# Patient Record
Sex: Male | Born: 1940 | Race: White | Hispanic: No | Marital: Married | Smoking: Former smoker
Health system: Southern US, Community
[De-identification: ages and names within clinical notes are randomized; demographics above are authoritative.]

## PROBLEM LIST (undated history)

## (undated) DIAGNOSIS — I10 Essential (primary) hypertension: Secondary | ICD-10-CM

## (undated) DIAGNOSIS — F0390 Unspecified dementia without behavioral disturbance: Secondary | ICD-10-CM

## (undated) DIAGNOSIS — E785 Hyperlipidemia, unspecified: Secondary | ICD-10-CM

## (undated) DIAGNOSIS — E119 Type 2 diabetes mellitus without complications: Secondary | ICD-10-CM

## (undated) DIAGNOSIS — I739 Peripheral vascular disease, unspecified: Secondary | ICD-10-CM

## (undated) DIAGNOSIS — I442 Atrioventricular block, complete: Secondary | ICD-10-CM

## (undated) DIAGNOSIS — Z950 Presence of cardiac pacemaker: Secondary | ICD-10-CM

## (undated) DIAGNOSIS — K219 Gastro-esophageal reflux disease without esophagitis: Secondary | ICD-10-CM

## (undated) DIAGNOSIS — H9190 Unspecified hearing loss, unspecified ear: Secondary | ICD-10-CM

## (undated) DIAGNOSIS — C801 Malignant (primary) neoplasm, unspecified: Secondary | ICD-10-CM

## (undated) HISTORY — PX: SURGERY OF LIP: SUR1315

## (undated) HISTORY — PX: COLONOSCOPY: SHX174

## (undated) HISTORY — PX: INSERT / REPLACE / REMOVE PACEMAKER: SUR710

## (undated) HISTORY — PX: OTHER SURGICAL HISTORY: SHX169

## (undated) HISTORY — PX: CARDIAC CATHETERIZATION: SHX172

---

## 2006-09-19 ENCOUNTER — Ambulatory Visit: Payer: Self-pay | Admitting: Gastroenterology

## 2006-12-02 ENCOUNTER — Other Ambulatory Visit: Payer: Self-pay

## 2006-12-02 ENCOUNTER — Emergency Department: Payer: Self-pay | Admitting: Emergency Medicine

## 2006-12-13 ENCOUNTER — Ambulatory Visit: Payer: Self-pay | Admitting: Unknown Physician Specialty

## 2008-03-13 ENCOUNTER — Ambulatory Visit: Payer: Self-pay | Admitting: Cardiology

## 2008-03-14 ENCOUNTER — Ambulatory Visit: Payer: Self-pay | Admitting: Cardiology

## 2012-07-13 ENCOUNTER — Ambulatory Visit: Payer: Self-pay | Admitting: Otolaryngology

## 2012-07-13 LAB — CREATININE, SERUM
Creatinine: 0.87 mg/dL (ref 0.60–1.30)
EGFR (Non-African Amer.): >60

## 2017-12-25 ENCOUNTER — Encounter: Payer: Self-pay | Admitting: Physician Assistant

## 2017-12-25 ENCOUNTER — Other Ambulatory Visit: Payer: Self-pay

## 2017-12-25 ENCOUNTER — Emergency Department
Admission: EM | Admit: 2017-12-25 | Discharge: 2017-12-25 | Disposition: A | Discharge status: Home / Self Care | Payer: Medicare PPO | Attending: Emergency Medicine | Admitting: Emergency Medicine

## 2017-12-25 ENCOUNTER — Emergency Department: Payer: Medicare PPO

## 2017-12-25 DIAGNOSIS — S61412A Laceration without foreign body of left hand, initial encounter: Secondary | ICD-10-CM

## 2017-12-25 DIAGNOSIS — Y999 Unspecified external cause status: Secondary | ICD-10-CM | POA: Diagnosis not present

## 2017-12-25 DIAGNOSIS — Z23 Encounter for immunization: Secondary | ICD-10-CM | POA: Insufficient documentation

## 2017-12-25 DIAGNOSIS — W268XXA Contact with other sharp object(s), not elsewhere classified, initial encounter: Secondary | ICD-10-CM | POA: Insufficient documentation

## 2017-12-25 DIAGNOSIS — Z7984 Long term (current) use of oral hypoglycemic drugs: Secondary | ICD-10-CM | POA: Insufficient documentation

## 2017-12-25 DIAGNOSIS — Z79899 Other long term (current) drug therapy: Secondary | ICD-10-CM | POA: Diagnosis not present

## 2017-12-25 DIAGNOSIS — Z7982 Long term (current) use of aspirin: Secondary | ICD-10-CM | POA: Insufficient documentation

## 2017-12-25 DIAGNOSIS — S62327B Displaced fracture of shaft of fifth metacarpal bone, left hand, initial encounter for open fracture: Secondary | ICD-10-CM

## 2017-12-25 DIAGNOSIS — Y929 Unspecified place or not applicable: Secondary | ICD-10-CM | POA: Diagnosis not present

## 2017-12-25 DIAGNOSIS — Y9389 Activity, other specified: Secondary | ICD-10-CM | POA: Diagnosis not present

## 2017-12-25 MED ORDER — SULFAMETHOXAZOLE-TRIMETHOPRIM 800-160 MG PO TABS
1.0000 | ORAL_TABLET | Freq: Two times a day (BID) | ORAL | 0 refills | Status: DC
Start: 2017-12-25 — End: 2019-08-06

## 2017-12-25 MED ORDER — LIDOCAINE HCL (PF) 1 % IJ SOLN
10.0000 mL | Freq: Once | INTRAMUSCULAR | Status: AC
  Administered 2017-12-25: 10 mL
  Filled 2017-12-25: qty 10

## 2017-12-25 MED ORDER — SULFAMETHOXAZOLE-TRIMETHOPRIM 800-160 MG PO TABS
1.0000 | ORAL_TABLET | Freq: Once | ORAL | Status: AC
  Administered 2017-12-25: 1 via ORAL
  Filled 2017-12-25: qty 1

## 2017-12-25 MED ORDER — TETANUS-DIPHTH-ACELL PERTUSSIS 5-2.5-18.5 LF-MCG/0.5 IM SUSP
0.5000 mL | Freq: Once | INTRAMUSCULAR | Status: AC
  Administered 2017-12-25: 0.5 mL via INTRAMUSCULAR
  Filled 2017-12-25: qty 0.5

## 2017-12-25 MED ORDER — TRAMADOL HCL 50 MG PO TABS: 50.0000 mg | ORAL_TABLET | Freq: Two times a day (BID) | ORAL | 0 refills | Status: DC

## 2017-12-25 NOTE — Discharge Instructions (Addendum)
Keep the wound & splint clean and dry. Change the dressing daily, if needed. Wash with soap & water as needed. Use gloves to keep the wound & dressing clean and dry. Call Dr. Jackqulyn Livings for fracture care. Follow-up with Dr. Baldemar Lenis for suture removal in 2 weeks.

## 2017-12-25 NOTE — ED Notes (Signed)
See triage note   States he caught his left hand in fan  Laceration to lateral aspect on left hand

## 2017-12-25 NOTE — ED Triage Notes (Signed)
Pt states he was working on a running [facility] unit and the fan caught his left hand causing approximately 4in lac, bleeding in controlled, clean saline gauze applied to wound,

## 2017-12-25 NOTE — ED Provider Notes (Signed)
[facility] Emergency Department Provider Note ____________________________________________  Time seen: 1335  I have reviewed the triage vital signs and the nursing notes.  HISTORY  Chief Complaint  Laceration  HPI Cole Mccarty. is a 77 y.o. right-handed male presents himself to the ED for evaluation of an accidental left hand laceration.  He admits to working on an [employer], and did not disconnect the power source.  Apparently as he reached in to the fan unit the motor turned on causing the fan to hit his lateral left hand.  He presents now with an approximately 7 cm laceration across the dorsal aspect of the hand that extends into the fourth interspace.  He denies any other injury at this time and does not have a current tetanus status.  Bleeding is currently controlled.  The wound according to the patient does expose the underlying tendons and vessels.  He admits to normal fist at this time and denies any significant pain.  History reviewed. No pertinent past medical history.  There are no active problems to display for this patient.  History reviewed. No pertinent surgical history.  Prior to Admission medications   Medication Sig Start Date End Date Taking? Authorizing Provider  amLODipine (NORVASC) 5 MG tablet Take 5 mg by mouth daily.   Yes [provider]  aspirin 81 MG chewable tablet Chew 81 mg by mouth daily.   Yes [provider]  losartan-hydrochlorothiazide (HYZAAR) 100-25 MG tablet Take 1 tablet by mouth daily.   Yes [provider]  metFORMIN (GLUCOPHAGE) 1000 MG tablet Take 1,000 mg by mouth 2 (two) times daily with a meal.   Yes [provider]  Misc Natural Products (LUTEIN 20 PO) Take by mouth.   Yes [provider]  rosuvastatin (CRESTOR) 20 MG tablet Take 20 mg by mouth daily.   Yes [provider]  sulfamethoxazole-trimethoprim (BACTRIM DS,SEPTRA DS) 800-160 MG tablet Take 1 tablet by mouth  2 (two) times daily. 12/25/17   Arelys Glassco, Dannielle Karvonen, PA-C  traMADol (ULTRAM) 50 MG tablet Take 1 tablet (50 mg total) by mouth 2 (two) times daily. 12/25/17   Mekia Dipinto, Dannielle Karvonen, PA-C    Allergies Ezetimibe; Niacin; Niacin-lovastatin er; and Simvastatin  No family history on file.  Social History Social History   Tobacco Use  . Smoking status: Not on file  Substance Use Topics  . Alcohol use: Not on file  . Drug use: Not on file    Review of Systems  Constitutional: Negative for fever. Cardiovascular: Negative for chest pain. Respiratory: Negative for shortness of breath. Musculoskeletal: Negative for back pain. Skin: Negative for rash. Left hand laceration as above Neurological: Negative for headaches, focal weakness or numbness. ____________________________________________  PHYSICAL EXAM:  VITAL SIGNS: ED Triage Vitals  Enc Vitals Group     BP 12/25/17 1255 (!) 120/53     Pulse Rate 12/25/17 1255 90     Resp 12/25/17 1255 20     Temp 12/25/17 1255 98.2 F (36.8 C)     Temp Source 12/25/17 1255 Oral     SpO2 12/25/17 1255 94 %     Weight 12/25/17 1256 170 lb (77.1 kg)     Height 12/25/17 1256 5\' 4"  (1.626 m)     Head Circumference --      Peak Flow --      Pain Score 12/25/17 1244 5     Pain Loc --      Pain Edu? --  Excl. in [place]? --     Constitutional: Alert and oriented. Well appearing and in no distress. Head: Normocephalic and atraumatic. Cardiovascular: Normal rate, regular rhythm. Normal distal pulses and cap refill. Respiratory: Normal respiratory effort. No wheezes/rales/rhonchi. Musculoskeletal: Left hand with normal composite fist.  Patient with a large laceration of the dorsal aspect of the lateral hand between the fourth and fifth metacarpals.  Exposure of the underlying extensor tendons is noted with an incomplete laceration to the extensor digiti minimi.  Patient is able to demonstrate normal composite fist on exam.  Nontender with  normal range of motion in all other extremities.  Neurologic:  Normal sensation. Normal speech and language. No gross focal neurologic deficits are appreciated. Skin:  Skin is warm, dry and intact. No rash noted. ____________________________________________   RADIOLOGY  Left Hand   IMPRESSION: 1. Laceration of left hand overlying the fifth metacarpal. Comminuted fracture of the mid shaft of the fifth metacarpal with apex dorsal angulation. ____________________________________________  PROCEDURES  Bactrim DS 1 PO Tdap 0.5 ml IM  .Marland KitchenLaceration Repair Date/Time: 12/25/2017 5:58 PM Performed by: Janyth Pupa, Student-PA Authorized by: Melvenia Needles, PA-C   Consent:    Consent obtained:  Verbal   Consent given by:  Patient   Risks discussed:  Poor wound healing and nerve damage   Alternatives discussed:  Referral Anesthesia (see MAR for exact dosages):    Anesthesia method:  Local infiltration   Local anesthetic:  Lidocaine 1% w/o epi Laceration details:    Location:  Hand   Hand location:  L hand, dorsum   Length (cm):  7 Repair type:    Repair type:  Intermediate Pre-procedure details:    Preparation:  Patient was prepped and draped in usual sterile fashion Exploration:    Hemostasis achieved with:  Direct pressure   Wound extent: tendon damage     Tendon damage location:  Upper extremity   Upper extremity tendon damage location:  Finger extensor   Finger extensor tendon:  Extensor digiti minimi   Tendon damage extent:  Partial transection   Tendon repair plan:  Refer for evaluation   Contaminated: no   Treatment:    Area cleansed with:  Betadine and saline   Irrigation solution:  Sterile saline   Irrigation method:  Syringe Subcutaneous repair:    Suture size:  4-0   Suture material:  Vicryl   Suture technique:  Running   Number of sutures:  1 Skin repair:    Repair method:  Sutures   Suture size:  3-0   Suture material:  Nylon   Suture  technique:  Simple interrupted   Number of sutures:  13 Approximation:    Approximation:  Close Post-procedure details:    Dressing:  Non-adherent dressing and splint for protection   Patient tolerance of procedure:  Tolerated well, no immediate complications  .Splint Application Date/Time: 2/42/3536 6:01 PM Performed by: Janyth Pupa, Student-PA Authorized by: Melvenia Needles, PA-C   Consent:    Consent obtained:  Verbal   Consent given by:  Patient   Risks discussed:  Swelling Pre-procedure details:    Sensation:  Normal Procedure details:    Laterality:  Left   Location:  Hand   Splint type:  Ulnar gutter   Supplies:  Cotton padding, elastic bandage and Ortho-Glass Post-procedure details:    Pain:  Improved   Sensation:  Normal   Patient tolerance of procedure:  Tolerated well, no immediate complications  ____________________________________________  INITIAL IMPRESSION / ASSESSMENT AND PLAN / ED COURSE  With ED evaluation of an accidental injury to the left hand.  Patient was reaction to a hiatal but powered AC unit when the unit motor excellently engaged.  Because of an accident laceration and open fracture to the left hand.  Patient was treated here appropriately with wound repair and initial fracture management.  He is placed in a ulnar gutter splint and given referral to Ortho-hand for further fracture management.  He is also given a prescription for Bactrim to dose as directed.  He is provided with Ultram for pain as needed.  He will follow-up with Ortho return to the ED as needed.  Work activities as tolerated.  I reviewed the patient's prescription history over the last 12 months in the multi-state controlled substances database(s) that includes [place], [place], [place], [place], [place], [place], [place], [place], [place], [place], [place], [place], [place], and [place].  Results were notable for no current prescriptions.   ____________________________________________  FINAL CLINICAL IMPRESSION(S) / ED DIAGNOSES  Final diagnoses:  Laceration of left hand without foreign body, initial encounter  Open displaced fracture of shaft of fifth metacarpal bone of left hand, initial encounter      Melvenia Needles, PA-C 12/25/17 1803    Arta Silence, MD 12/25/17 2047

## 2018-02-09 ENCOUNTER — Encounter: Payer: Self-pay | Admitting: *Deleted

## 2018-02-12 ENCOUNTER — Ambulatory Visit: Payer: Medicare PPO | Admitting: Certified Registered Nurse Anesthetist

## 2018-02-12 ENCOUNTER — Encounter
Admission: RE | Disposition: A | Discharge status: Home / Self Care | Payer: Self-pay | Source: Ambulatory Visit | Attending: Gastroenterology

## 2018-02-12 ENCOUNTER — Other Ambulatory Visit: Payer: Self-pay

## 2018-02-12 ENCOUNTER — Encounter: Payer: Self-pay | Admitting: *Deleted

## 2018-02-12 ENCOUNTER — Ambulatory Visit
Admission: RE | Admit: 2018-02-12 | Discharge: 2018-02-12 | Disposition: A | Discharge status: Home / Self Care | Payer: Medicare PPO | Source: Ambulatory Visit | Attending: Gastroenterology | Admitting: Gastroenterology

## 2018-02-12 DIAGNOSIS — Z7984 Long term (current) use of oral hypoglycemic drugs: Secondary | ICD-10-CM | POA: Insufficient documentation

## 2018-02-12 DIAGNOSIS — E785 Hyperlipidemia, unspecified: Secondary | ICD-10-CM | POA: Diagnosis not present

## 2018-02-12 DIAGNOSIS — Z7982 Long term (current) use of aspirin: Secondary | ICD-10-CM | POA: Insufficient documentation

## 2018-02-12 DIAGNOSIS — K635 Polyp of colon: Secondary | ICD-10-CM | POA: Diagnosis not present

## 2018-02-12 DIAGNOSIS — Z888 Allergy status to other drugs, medicaments and biological substances status: Secondary | ICD-10-CM | POA: Diagnosis not present

## 2018-02-12 DIAGNOSIS — D123 Benign neoplasm of transverse colon: Secondary | ICD-10-CM | POA: Insufficient documentation

## 2018-02-12 DIAGNOSIS — Z1211 Encounter for screening for malignant neoplasm of colon: Secondary | ICD-10-CM | POA: Diagnosis present

## 2018-02-12 DIAGNOSIS — Z85819 Personal history of malignant neoplasm of unspecified site of lip, oral cavity, and pharynx: Secondary | ICD-10-CM | POA: Insufficient documentation

## 2018-02-12 DIAGNOSIS — I10 Essential (primary) hypertension: Secondary | ICD-10-CM | POA: Diagnosis not present

## 2018-02-12 DIAGNOSIS — E1151 Type 2 diabetes mellitus with diabetic peripheral angiopathy without gangrene: Secondary | ICD-10-CM | POA: Diagnosis not present

## 2018-02-12 DIAGNOSIS — Z79899 Other long term (current) drug therapy: Secondary | ICD-10-CM | POA: Insufficient documentation

## 2018-02-12 DIAGNOSIS — K5730 Diverticulosis of large intestine without perforation or abscess without bleeding: Secondary | ICD-10-CM | POA: Diagnosis not present

## 2018-02-12 DIAGNOSIS — I442 Atrioventricular block, complete: Secondary | ICD-10-CM | POA: Insufficient documentation

## 2018-02-12 DIAGNOSIS — Z950 Presence of cardiac pacemaker: Secondary | ICD-10-CM | POA: Diagnosis not present

## 2018-02-12 HISTORY — DX: Essential (primary) hypertension: I10

## 2018-02-12 HISTORY — DX: Type 2 diabetes mellitus without complications: E11.9

## 2018-02-12 HISTORY — DX: Gastro-esophageal reflux disease without esophagitis: K21.9

## 2018-02-12 HISTORY — DX: Peripheral vascular disease, unspecified: I73.9

## 2018-02-12 HISTORY — PX: COLONOSCOPY WITH PROPOFOL: SHX5780

## 2018-02-12 HISTORY — DX: Hyperlipidemia, unspecified: E78.5

## 2018-02-12 HISTORY — DX: Atrioventricular block, complete: I44.2

## 2018-02-12 HISTORY — DX: Malignant (primary) neoplasm, unspecified: C80.1

## 2018-02-12 LAB — GLUCOSE, CAPILLARY: Glucose-Capillary: 137 mg/dL — ABNORMAL HIGH (ref 70–99)

## 2018-02-12 SURGERY — COLONOSCOPY WITH PROPOFOL
Anesthesia: General

## 2018-02-12 MED ORDER — PROPOFOL 10 MG/ML IV BOLUS
INTRAVENOUS | Status: DC | PRN
  Administered 2018-02-12: 70 mg via INTRAVENOUS

## 2018-02-12 MED ORDER — PROPOFOL 500 MG/50ML IV EMUL
INTRAVENOUS | Status: AC
  Filled 2018-02-12: qty 50

## 2018-02-12 MED ORDER — SODIUM CHLORIDE 0.9 % IV SOLN
INTRAVENOUS | Status: DC
  Administered 2018-02-12: 10:00:00 via INTRAVENOUS

## 2018-02-12 MED ORDER — LIDOCAINE HCL (PF) 2 % IJ SOLN
INTRAMUSCULAR | Status: AC
  Filled 2018-02-12: qty 10

## 2018-02-12 MED ORDER — PROPOFOL 500 MG/50ML IV EMUL
INTRAVENOUS | Status: DC | PRN
Start: 2018-02-12 — End: 2018-02-12
  Administered 2018-02-12: 130 ug/kg/min via INTRAVENOUS

## 2018-02-12 MED ORDER — LIDOCAINE HCL (CARDIAC) PF 100 MG/5ML IV SOSY
PREFILLED_SYRINGE | INTRAVENOUS | Status: DC | PRN
  Administered 2018-02-12: 50 mg via INTRAVENOUS

## 2018-02-12 MED ORDER — SODIUM CHLORIDE 0.9 % IV SOLN: INTRAVENOUS | Status: DC

## 2018-02-12 NOTE — Anesthesia Post-op Follow-up Note (Signed)
Anesthesia QCDR form completed.        

## 2018-02-12 NOTE — Anesthesia Preprocedure Evaluation (Signed)
Anesthesia Evaluation  Patient identified by MRN, date of birth, ID band Patient awake    Reviewed: Allergy & Precautions, H&P , NPO status , Patient's Chart, lab work & pertinent test results, reviewed documented beta blocker date and time   Airway Mallampati: II   Neck ROM: full    Dental  (+) Poor Dentition, Teeth Intact   Pulmonary neg pulmonary ROS, former smoker,    Pulmonary exam normal        Cardiovascular Exercise Tolerance: Good hypertension, On Medications + Peripheral Vascular Disease  negative cardio ROS Normal cardiovascular exam+ dysrhythmias  Rhythm:regular Rate:Normal     Neuro/Psych negative neurological ROS  negative psych ROS   GI/Hepatic negative GI ROS, Neg liver ROS, GERD  Medicated,  Endo/Other  negative endocrine ROSdiabetes, Well Controlled, Type 2  Renal/GU negative Renal ROS  negative genitourinary   Musculoskeletal   Abdominal   Peds  Hematology negative hematology ROS (+)   Anesthesia Other Findings Past Medical History: No date: Cancer ([place])     Comment:  lip cancer No date: Complete heart block (HCC) No date: Diabetes mellitus without complication (HCC) No date: GERD (gastroesophageal reflux disease) No date: Hyperlipidemia No date: Hypertension No date: PVD (peripheral vascular disease) ([place]) Past Surgical History: No date: COLONOSCOPY No date: insert/replace/pacemaker No date: SURGERY OF LIP   Reproductive/Obstetrics negative OB ROS                             Anesthesia Physical Anesthesia Plan  ASA: III  Anesthesia Plan: General   Post-op Pain Management:    Induction:   PONV Risk Score and Plan:   Airway Management Planned:   Additional Equipment:   Intra-op Plan:   Post-operative Plan:   Informed Consent: I have reviewed the patients History and Physical, chart, labs and discussed the procedure including the risks, benefits  and alternatives for the proposed anesthesia with the patient or authorized representative who has indicated his/her understanding and acceptance.   Dental Advisory Given  Plan Discussed with: CRNA  Anesthesia Plan Comments:         Anesthesia Quick Evaluation

## 2018-02-12 NOTE — Transfer of Care (Signed)
Immediate Anesthesia Transfer of Care Note  Patient: Cole Jordan Pewitt Sr.  Procedure(s) Performed: COLONOSCOPY WITH PROPOFOL (N/A )  Patient Location: PACU and Endoscopy Unit  Anesthesia Type:General  Level of Consciousness: drowsy  Airway & Oxygen Therapy: Patient Spontanous Breathing and Patient connected to nasal cannula oxygen  Post-op Assessment: Report given to RN and Post -op Vital signs reviewed and stable  Post vital signs: Reviewed and stable  Last Vitals:  Vitals Value Taken Time  BP    Temp    Pulse 66 02/12/2018 10:44 AM  Resp 11 02/12/2018 10:44 AM  SpO2 95 % 02/12/2018 10:44 AM  Vitals shown include unvalidated device data.  Last Pain:  Vitals:   02/12/18 0939  TempSrc: Tympanic  PainSc: 0-No pain         Complications: No apparent anesthesia complications

## 2018-02-12 NOTE — Anesthesia Postprocedure Evaluation (Signed)
Anesthesia Post Note  Patient: [place]  Procedure(s) Performed: COLONOSCOPY WITH PROPOFOL (N/A )  Patient location during evaluation: PACU Anesthesia Type: General Level of consciousness: awake and alert Pain management: pain level controlled Vital Signs Assessment: post-procedure vital signs reviewed and stable Respiratory status: spontaneous breathing, nonlabored ventilation, respiratory function stable and patient connected to nasal cannula oxygen Cardiovascular status: blood pressure returned to baseline and stable Postop Assessment: no apparent nausea or vomiting Anesthetic complications: no     Last Vitals:  Vitals:   02/12/18 1100 02/12/18 1110  BP: 112/63 119/67  Pulse:  65  Resp:  15  Temp:    SpO2:  100%    Last Pain:  Vitals:   02/12/18 1040  TempSrc: Tympanic  PainSc:                  Molli Barrows

## 2018-02-12 NOTE — Op Note (Signed)
[facility] Gastroenterology Patient Name: Cole Mccarty Procedure Date: 02/12/2018 10:15 AM MRN: [id number] Account #: [id number] Date of Birth: 09-12-1940 Admit Type: Outpatient Age: 77 Room: [facility] ENDO ROOM 1 Gender: Male Note Status: Finalized Procedure:            Colonoscopy Indications:          Screening for colorectal malignant neoplasm Providers:            Lollie Sails, MD Referring MD:         Caprice Renshaw MD (Referring MD) Medicines:            Monitored Anesthesia Care Complications:        No immediate complications. Procedure:            Pre-Anesthesia Assessment:                       - ASA Grade Assessment: III - A patient with severe                        systemic disease.                       After obtaining informed consent, the colonoscope was                        passed under direct vision. Throughout the procedure,                        the patient's blood pressure, pulse, and oxygen                        saturations were monitored continuously. The                        Colonoscope was introduced through the anus and                        advanced to the the cecum, identified by appendiceal                        orifice and ileocecal valve. The colonoscopy was                        performed without difficulty. The patient tolerated the                        procedure well. The quality of the bowel preparation                        was good. Findings:      A 4 mm polyp was found in the distal transverse colon. The polyp was       sessile. The polyp was removed with a cold snare. Resection and       retrieval were complete.      A 4 mm polyp was found in the distal sigmoid colon. The polyp was       sessile. The polyp was removed with a cold snare. Resection and       retrieval were complete.      Multiple small-mouthed diverticula were found in the sigmoid colon and  descending colon.      The retroflexed view of  the distal rectum and anal verge was normal and       showed no anal or rectal abnormalities.      The digital rectal exam was normal. Impression:           - One 4 mm polyp in the distal transverse colon,                        removed with a cold snare. Resected and retrieved.                       - One 4 mm polyp in the distal sigmoid colon, removed                        with a cold snare. Resected and retrieved.                       - Diverticulosis in the sigmoid colon and in the                        descending colon.                       - The distal rectum and anal verge are normal on                        retroflexion view. Recommendation:       - Discharge patient to home.                       - Advance diet as tolerated.                       - Await pathology results.                       - Telephone GI clinic for pathology results in 1 week. Procedure Code(s):    --- Professional ---                       [id number], Colonoscopy, flexible; with removal of tumor(s),                        polyp(s), or other lesion(s) by snare technique Diagnosis Code(s):    --- Professional ---                       Z12.11, Encounter for screening for malignant neoplasm                        of colon                       D12.3, Benign neoplasm of transverse colon (hepatic                        flexure or splenic flexure)                       D12.5, Benign neoplasm of sigmoid colon  K57.30, Diverticulosis of large intestine without                        perforation or abscess without bleeding CPT copyright 2017 American Medical Association. All rights reserved. The codes documented in this report are preliminary and upon coder review may  be revised to meet current compliance requirements. Lollie Sails, MD 02/12/2018 10:42:38 AM This report has been signed electronically. Number of Addenda: 0 Note Initiated On: 02/12/2018 10:15 AM Scope Withdrawal Time: 0  hours 9 minutes 42 seconds  Total Procedure Duration: 0 hours 17 minutes 57 seconds       [facility]

## 2018-02-12 NOTE — H&P (Signed)
Outpatient short stay form Pre-procedure 02/12/2018 10:11 AM Cole Mccarty  Primary Physician: Dr Cole Mccarty  Reason for visit: Colonoscopy  History of present illness: Patient is a 77 year old male presenting today colon cancer screening.  His last colonoscopy was over 10 years ago with a finding of a hyperplastic polyp.  Also some diverticulosis.  She tolerated his procedure well.  He takes a 81 mg aspirin daily.  He takes no other aspirin products or blood thinning agent.  He has held his aspirin.  Patient does have a pacemaker for history of complete heart block.    Current Facility-Administered Medications:  .  0.9 %  sodium chloride infusion, , Intravenous, Continuous, Cole Sails, Mccarty .  0.9 %  sodium chloride infusion, , Intravenous, Continuous, Cole Sails, Mccarty, Last Rate: 20 mL/hr at 02/12/18 1006  Medications Prior to Admission  Medication Sig Dispense Refill Last Dose  . amLODipine (NORVASC) 5 MG tablet Take 5 mg by mouth daily.   02/12/2018 at 0700  . aspirin 81 MG chewable tablet Chew 81 mg by mouth daily.   02/07/2018 at Unknown time  . losartan-hydrochlorothiazide (HYZAAR) 100-25 MG tablet Take 1 tablet by mouth daily.   02/12/2018 at 0700  . metFORMIN (GLUCOPHAGE) 1000 MG tablet Take 1,000 mg by mouth 2 (two) times daily with a meal.   Past Week at Unknown time  . Misc Natural Products (LUTEIN 20 PO) Take by mouth.   Past Week at Unknown time  . rosuvastatin (CRESTOR) 20 MG tablet Take 20 mg by mouth daily.   02/12/2018 at 0700  . sulfamethoxazole-trimethoprim (BACTRIM DS,SEPTRA DS) 800-160 MG tablet Take 1 tablet by mouth 2 (two) times daily. 20 tablet 0 Past Week at Unknown time  . traMADol (ULTRAM) 50 MG tablet Take 1 tablet (50 mg total) by mouth 2 (two) times daily. (Patient not taking: Reported on 02/12/2018) 10 tablet 0 Completed Course at Unknown time     Allergies  Allergen Reactions  . Ezetimibe Other (See Comments)  . Niacin Other (See  Comments)  . Niacin-Lovastatin Er     Other reaction(s): Muscle Pain  . Simvastatin     Other reaction(s): Unknown     Past Medical History:  Diagnosis Date  . Cancer (HCC)    lip cancer  . Complete heart block ([place])   . Diabetes mellitus without complication ([place])   . GERD (gastroesophageal reflux disease)   . Hyperlipidemia   . Hypertension   . PVD (peripheral vascular disease) ([place])     Review of systems:      Physical Exam    Heart and lungs: Rate and rhythm without rub or gallop, lungs are bilaterally clear.    HEENT: Normocephalic atraumatic eyes are anicteric    Other:    Pertinant exam for procedure: Soft nontender nondistended bowel sounds positive normoactive.    Planned proceedures: Colonoscopy and indicated procedures. I have discussed the risks benefits and complications of procedures to include not limited to bleeding, infection, perforation and the risk of sedation and the patient wishes to proceed.    Cole Sails, [facility] 02/12/2018  10:11 AM

## 2018-02-13 ENCOUNTER — Encounter: Payer: Self-pay | Admitting: Gastroenterology

## 2018-02-13 LAB — SURGICAL PATHOLOGY

## 2019-05-12 ENCOUNTER — Encounter: Payer: Self-pay | Admitting: Emergency Medicine

## 2019-05-12 ENCOUNTER — Emergency Department: Payer: Medicare PPO

## 2019-05-12 ENCOUNTER — Other Ambulatory Visit: Payer: Self-pay

## 2019-05-12 DIAGNOSIS — Z87891 Personal history of nicotine dependence: Secondary | ICD-10-CM | POA: Diagnosis not present

## 2019-05-12 DIAGNOSIS — Z85818 Personal history of malignant neoplasm of other sites of lip, oral cavity, and pharynx: Secondary | ICD-10-CM | POA: Diagnosis not present

## 2019-05-12 DIAGNOSIS — Z79899 Other long term (current) drug therapy: Secondary | ICD-10-CM | POA: Insufficient documentation

## 2019-05-12 DIAGNOSIS — I10 Essential (primary) hypertension: Secondary | ICD-10-CM | POA: Insufficient documentation

## 2019-05-12 DIAGNOSIS — M79602 Pain in left arm: Secondary | ICD-10-CM | POA: Diagnosis not present

## 2019-05-12 DIAGNOSIS — E119 Type 2 diabetes mellitus without complications: Secondary | ICD-10-CM | POA: Insufficient documentation

## 2019-05-12 DIAGNOSIS — Z7982 Long term (current) use of aspirin: Secondary | ICD-10-CM | POA: Insufficient documentation

## 2019-05-12 LAB — BASIC METABOLIC PANEL
Anion gap: 13 (ref 5–15)
BUN: 23 mg/dL (ref 8–23)
CO2: 26 mmol/L (ref 22–32)
Calcium: 10.2 mg/dL (ref 8.9–10.3)
Chloride: 102 mmol/L (ref 98–111)
Creatinine, Ser: 1.1 mg/dL (ref 0.61–1.24)
GFR calc Af Amer: >60 mL/min (ref >60)
GFR calc non Af Amer: >60 mL/min (ref >60)
Glucose, Bld: 176 mg/dL — ABNORMAL HIGH (ref 70–99)
Potassium: 3.9 mmol/L (ref 3.5–5.1)
Sodium: 141 mmol/L (ref 135–145)

## 2019-05-12 LAB — CBC
HCT: 42 % (ref 39.0–52.0)
Hemoglobin: 13.5 g/dL (ref 13.0–17.0)
MCH: 29.2 pg (ref 26.0–34.0)
MCHC: 32.1 g/dL (ref 30.0–36.0)
MCV: 90.7 fL (ref 80.0–100.0)
Platelets: 213 10*3/uL (ref 150–400)
RBC: 4.63 MIL/uL (ref 4.22–5.81)
RDW: 13.7 % (ref 11.5–15.5)
WBC: 6.5 10*3/uL (ref 4.0–10.5)
nRBC: 0 % (ref 0.0–0.2)

## 2019-05-12 LAB — TROPONIN I (HIGH SENSITIVITY): Troponin I (High Sensitivity): 14 ng/L (ref <18)

## 2019-05-12 NOTE — ED Triage Notes (Signed)
Patient with complaint of left arm pain this evening times thirty minutes that he states stopped while he was waiting for triage. Patient denies chest pain or shortness of breath. Patient states that he does have a pacemaker.

## 2019-05-13 ENCOUNTER — Encounter: Payer: Self-pay | Admitting: Emergency Medicine

## 2019-05-13 ENCOUNTER — Emergency Department
Admission: EM | Admit: 2019-05-13 | Discharge: 2019-05-13 | Disposition: A | Discharge status: Home / Self Care | Payer: Medicare PPO | Attending: Emergency Medicine | Admitting: Emergency Medicine

## 2019-05-13 DIAGNOSIS — M79602 Pain in left arm: Secondary | ICD-10-CM

## 2019-05-13 HISTORY — DX: Unspecified hearing loss, unspecified ear: H91.90

## 2019-05-13 LAB — TROPONIN I (HIGH SENSITIVITY): Troponin I (High Sensitivity): 5 ng/L (ref <18)

## 2019-05-13 NOTE — Discharge Instructions (Signed)
Your workup in the Emergency Department today was reassuring.  We did not find any specific abnormalities.  We recommend you drink plenty of fluids, take your regular medications and/or any new ones prescribed today, and follow up with the doctor(s) listed in these documents as recommended.  Return to the Emergency Department if you develop new or worsening symptoms that concern you.  

## 2019-05-13 NOTE — ED Provider Notes (Signed)
[facility] Emergency Department Provider Note  ____________________________________________   First MD Initiated Contact with Patient 05/13/19 [phone]     (approximate)  I have reviewed the triage vital signs and the nursing notes.   HISTORY  Chief Complaint Arm Pain    HPI Cole DEVICH Sr. is a 78 y.o. 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  He presents tonight for acute onset pain in his left arm.  He reports that it hurt from the outside of his left shoulder down to the outside of his left forearm.  No history of trauma or strain.  It was both a sharp and aching pain.  Nothing in particular made it better or worse.  It was not worse with moving around and did not radiate into his chest or his back.  He denies any chest pain.  He denies shortness of breath.  He has not had contact with COVID-19 patients.  He denies fever/chills, nausea, vomiting, and abdominal pain.  He reports the pain was severe but it has completely gone away and in fact it went away by itself while he was in triage and he has not had any pain since then.  He has been waiting for a bed for about 6 hours.        Past Medical History:  Diagnosis Date  . Cancer (HCC)    lip cancer  . Complete heart block ([place])   . Diabetes mellitus without complication ([place])   . GERD (gastroesophageal reflux disease)   . Hard of hearing    severe  . Hyperlipidemia   . Hypertension   . PVD (peripheral vascular disease) ([place])     There are no active problems to display for this patient.   Past Surgical History:  Procedure Laterality Date  . COLONOSCOPY    . COLONOSCOPY WITH PROPOFOL N/A 02/12/2018   Procedure: COLONOSCOPY WITH PROPOFOL;  Surgeon: Lollie Sails, MD;  Location: [facility] ENDOSCOPY;  Service: Endoscopy;  Laterality: N/A;  .  insert/replace/pacemaker    . SURGERY OF LIP      Prior to Admission medications   Medication Sig Start Date End Date Taking? Authorizing Provider  amLODipine (NORVASC) 5 MG tablet Take 5 mg by mouth daily.    [provider]  aspirin 81 MG chewable tablet Chew 81 mg by mouth daily.    [provider]  losartan-hydrochlorothiazide (HYZAAR) 100-25 MG tablet Take 1 tablet by mouth daily.    [provider]  metFORMIN (GLUCOPHAGE) 1000 MG tablet Take 1,000 mg by mouth 2 (two) times daily with a meal.    [provider]  Misc Natural Products (LUTEIN 20 PO) Take by mouth.    [provider]  rosuvastatin (CRESTOR) 20 MG tablet Take 20 mg by mouth daily.    [provider]  sulfamethoxazole-trimethoprim (BACTRIM DS,SEPTRA DS) 800-160 MG tablet Take 1 tablet by mouth 2 (two) times daily. 12/25/17   Menshew, Dannielle Karvonen, PA-C  traMADol (ULTRAM) 50 MG tablet Take 1 tablet (50 mg total) by mouth 2 (two) times daily. Patient not taking: Reported on 02/12/2018 12/25/17   Menshew, Dannielle Karvonen, PA-C    Allergies Ezetimibe, Niacin, Niacin-lovastatin er, and Simvastatin  History reviewed. No pertinent family history.  Social History Social History   Tobacco Use  . Smoking status: Former Smoker  Years: 30.00    Types: Cigarettes    Quit date: 07/11/1989    Years since quitting: 29.8  . Smokeless tobacco: Never Used  Substance Use Topics  . Alcohol use: Not Currently  . Drug use: Not Currently    Review of Systems Constitutional: No fever/chills Eyes: No visual changes. ENT: No sore throat. Cardiovascular: Denies chest pain. Respiratory: Denies shortness of breath. Gastrointestinal: No abdominal pain.  No nausea, no vomiting.  No diarrhea.  No constipation. Genitourinary: Negative for dysuria. Musculoskeletal: Left arm pain as described above.  Negative for neck pain.  Negative for back pain. Integumentary: Negative for rash.  Neurological: Negative for headaches, focal weakness or numbness.   ____________________________________________   PHYSICAL EXAM:  VITAL SIGNS: ED Triage Vitals [05/12/19 2135]  Enc Vitals Group     BP 136/71     Pulse Rate 72     Resp 18     Temp 98.1 F (36.7 C)     Temp Source Oral     SpO2 98 %     Weight 63.5 kg (140 lb)     Height 1.676 m (5\' 6" )     Head Circumference      Peak Flow      Pain Score 0     Pain Loc      Pain Edu?      Excl. in [place]?     Constitutional: Alert and oriented.  No acute distress. Eyes: Conjunctivae are normal.  Head: Atraumatic. Nose: No congestion/rhinnorhea. Mouth/Throat: Patient is wearing a mask. Neck: No stridor.  No meningeal signs.   Cardiovascular: Normal rate, regular rhythm. Good peripheral circulation. Grossly normal heart sounds.  Implanted pacemaker. Respiratory: Normal respiratory effort.  No retractions. Gastrointestinal: Soft and nontender. No distention.  Musculoskeletal: No lower extremity tenderness nor edema. No gross deformities of extremities.  The patient has no reproducible pain or tenderness with palpation of the left arm and full range of motion of the arm. Neurologic:  Normal speech and language. No gross focal neurologic deficits are appreciated.  Skin:  Skin is warm, dry and intact. Psychiatric: Mood and affect are normal. Speech and behavior are normal.  ____________________________________________   LABS (all labs ordered are listed, but only abnormal results are displayed)  Labs Reviewed  BASIC METABOLIC PANEL - Abnormal; Notable for the following components:      Result Value   Glucose, Bld 176 (*)    All other components within normal limits  CBC  TROPONIN I (HIGH SENSITIVITY)  TROPONIN I (HIGH SENSITIVITY)   ____________________________________________  EKG  ED ECG REPORT I, Hinda Kehr, the attending physician, personally viewed and interpreted this ECG.  Date: 05/12/2019 EKG Time:  21:26 Rate: 86 Rhythm: paced rhythm QRS Axis: paced rhythm Intervals: abnormal due to pacemaker ST/T Wave abnormalities: Non-specific ST segment / T-wave changes, but no evidence of acute ischemia. Narrative Interpretation: No acute ischemia evidence of paced rhythm    ____________________________________________  RADIOLOGY I, Hinda Kehr, personally viewed and evaluated these images (plain radiographs) as part of my medical decision making, as well as reviewing the written report by the radiologist.  ED MD interpretation: No acute abnormalities on chest x-ray  Official radiology report(s): Dg Chest 2 View  Result Date: 05/12/2019 CLINICAL DATA:  LEFT arm pain this evening for 30 minutes, history of complete heart block with pacemaker placement, diabetes mellitus, hypertension EXAM: CHEST - 2 VIEW COMPARISON:  None FINDINGS: LEFT subclavian sequential transvenous pacemaker leads project at RIGHT atrium and  RIGHT ventricle. Normal heart size and pulmonary vascularity Calcified tortuous aorta. Mild bibasilar atelectasis. Lungs otherwise clear. Prominent first costochondral junctions bilaterally. No infiltrate, pleural effusion or pneumothorax. Bones demineralized. IMPRESSION: Bibasilar atelectasis. Aortic atherosclerosis. Electronically Signed   By: Lavonia Dana M.D.   On: 05/12/2019 22:14    ____________________________________________   PROCEDURES   Procedure(s) performed (including Critical Care):  Procedures   ____________________________________________   INITIAL IMPRESSION / MDM / [place] / ED COURSE  As part of my medical decision making, I reviewed the following data within the [place] notes reviewed and incorporated, Labs reviewed , EKG interpreted , Old EKG reviewed, Radiograph reviewed  and Notes from prior ED visits   Differential diagnosis includes, but is not limited to, musculoskeletal pain/strain, fracture dislocation,  ACS.  The patient is well-appearing in no distress.  No reproducible pain at this time.  He had no chest pain but given his history I will repeat a troponin.  However his EKG is unchanged from prior and demonstrates a paced rhythm, his symptoms have gone completely, and his lab work is all reassuring including an initial high-sensitivity troponin of 5 and a normal basic metabolic panel and CBC.  Chest x-ray is normal.  He made the comment that if he had been younger he would not have come in for the arm pain but he is a little bit more cautious now with age and given his heart problems which I think is understandable.  He is comfortable with the plan for discharge and outpatient follow-up assuming the second high-sensitivity troponin is appropriate.      Clinical Course as of May 13 331  [facility] May 13, 2019  0332 Upon further review, I see that the patient actually has had 2 high-sensitivity troponins tonight, going from 14-5.  No indication for a repeat.  Patient is asymptomatic and comfortable with the plan for discharge.   [CF]    Clinical Course User Index [CF] Hinda Kehr, MD     ____________________________________________  FINAL CLINICAL IMPRESSION(S) / ED DIAGNOSES  Final diagnoses:  Left arm pain     MEDICATIONS GIVEN DURING THIS VISIT:  Medications - No data to display   ED Discharge Orders    None      *Please note:  Cole Fok Bozman Sr. was evaluated in Emergency Department on 05/13/2019 for the symptoms described in the history of present illness. He was evaluated in the context of the global COVID-19 pandemic, which necessitated consideration that the patient might be at risk for infection with the SARS-CoV-2 virus that causes COVID-19. Institutional protocols and algorithms that pertain to the evaluation of patients at risk for COVID-19 are in a state of rapid change based on information released by regulatory bodies including the CDC and federal and state organizations.  These policies and algorithms were followed during the patient's care in the ED.  Some ED evaluations and interventions may be delayed as a result of limited staffing during the pandemic.*  Note:  This document was prepared using Dragon voice recognition software and may include unintentional dictation errors.   Hinda Kehr, MD 05/13/19 [phone]

## 2019-07-22 ENCOUNTER — Ambulatory Visit: Admit: 2019-07-22 | Payer: Medicare PPO | Admitting: Cardiology

## 2019-07-22 SURGERY — INSERTION, CARDIAC PACEMAKER
Anesthesia: Choice | Laterality: Left

## 2019-08-02 ENCOUNTER — Other Ambulatory Visit: Payer: Self-pay

## 2019-08-02 ENCOUNTER — Other Ambulatory Visit
Admission: RE | Admit: 2019-08-02 | Discharge: 2019-08-02 | Disposition: A | Discharge status: Home / Self Care | Payer: Medicare PPO | Source: Ambulatory Visit | Attending: Internal Medicine | Admitting: Internal Medicine

## 2019-08-02 DIAGNOSIS — Z20822 Contact with and (suspected) exposure to covid-19: Secondary | ICD-10-CM | POA: Diagnosis not present

## 2019-08-02 DIAGNOSIS — Z01812 Encounter for preprocedural laboratory examination: Secondary | ICD-10-CM | POA: Diagnosis present

## 2019-08-02 LAB — SARS CORONAVIRUS 2 (TAT 6-24 HRS): SARS Coronavirus 2: NEGATIVE

## 2019-08-06 MED ORDER — CEFAZOLIN SODIUM-DEXTROSE 2-4 GM/100ML-% IV SOLN
2.0000 g | INTRAVENOUS | Status: AC
  Administered 2019-08-07: 2 g via INTRAVENOUS

## 2019-08-07 ENCOUNTER — Ambulatory Visit
Admission: RE | Admit: 2019-08-07 | Discharge: 2019-08-07 | Disposition: A | Discharge status: Home / Self Care | Payer: Medicare PPO | Attending: Cardiology | Admitting: Cardiology

## 2019-08-07 ENCOUNTER — Ambulatory Visit: Payer: Medicare PPO | Admitting: Certified Registered Nurse Anesthetist

## 2019-08-07 ENCOUNTER — Other Ambulatory Visit: Payer: Self-pay

## 2019-08-07 ENCOUNTER — Encounter
Admission: RE | Disposition: A | Discharge status: Home / Self Care | Payer: Self-pay | Source: Home / Self Care | Attending: Cardiology

## 2019-08-07 ENCOUNTER — Encounter: Payer: Self-pay | Admitting: Cardiology

## 2019-08-07 DIAGNOSIS — E1151 Type 2 diabetes mellitus with diabetic peripheral angiopathy without gangrene: Secondary | ICD-10-CM | POA: Insufficient documentation

## 2019-08-07 DIAGNOSIS — I442 Atrioventricular block, complete: Secondary | ICD-10-CM | POA: Insufficient documentation

## 2019-08-07 DIAGNOSIS — Z8249 Family history of ischemic heart disease and other diseases of the circulatory system: Secondary | ICD-10-CM | POA: Diagnosis not present

## 2019-08-07 DIAGNOSIS — Z7982 Long term (current) use of aspirin: Secondary | ICD-10-CM | POA: Insufficient documentation

## 2019-08-07 DIAGNOSIS — Z87891 Personal history of nicotine dependence: Secondary | ICD-10-CM | POA: Diagnosis not present

## 2019-08-07 DIAGNOSIS — Z7984 Long term (current) use of oral hypoglycemic drugs: Secondary | ICD-10-CM | POA: Diagnosis not present

## 2019-08-07 DIAGNOSIS — E7800 Pure hypercholesterolemia, unspecified: Secondary | ICD-10-CM | POA: Diagnosis not present

## 2019-08-07 DIAGNOSIS — I10 Essential (primary) hypertension: Secondary | ICD-10-CM | POA: Diagnosis not present

## 2019-08-07 DIAGNOSIS — I6523 Occlusion and stenosis of bilateral carotid arteries: Secondary | ICD-10-CM | POA: Diagnosis not present

## 2019-08-07 DIAGNOSIS — I480 Paroxysmal atrial fibrillation: Secondary | ICD-10-CM | POA: Diagnosis not present

## 2019-08-07 DIAGNOSIS — Z45010 Encounter for checking and testing of cardiac pacemaker pulse generator [battery]: Secondary | ICD-10-CM | POA: Insufficient documentation

## 2019-08-07 DIAGNOSIS — Z79899 Other long term (current) drug therapy: Secondary | ICD-10-CM | POA: Diagnosis not present

## 2019-08-07 DIAGNOSIS — Z8719 Personal history of other diseases of the digestive system: Secondary | ICD-10-CM | POA: Diagnosis not present

## 2019-08-07 DIAGNOSIS — K219 Gastro-esophageal reflux disease without esophagitis: Secondary | ICD-10-CM | POA: Diagnosis not present

## 2019-08-07 DIAGNOSIS — E785 Hyperlipidemia, unspecified: Secondary | ICD-10-CM | POA: Diagnosis not present

## 2019-08-07 DIAGNOSIS — E782 Mixed hyperlipidemia: Secondary | ICD-10-CM | POA: Insufficient documentation

## 2019-08-07 DIAGNOSIS — I420 Dilated cardiomyopathy: Secondary | ICD-10-CM | POA: Insufficient documentation

## 2019-08-07 HISTORY — PX: PACEMAKER INSERTION: SHX728

## 2019-08-07 LAB — GLUCOSE, CAPILLARY
Glucose-Capillary: 159 mg/dL — ABNORMAL HIGH (ref 70–99)
Glucose-Capillary: 137 mg/dL — ABNORMAL HIGH (ref 70–99)

## 2019-08-07 SURGERY — INSERTION, CARDIAC PACEMAKER
Anesthesia: General

## 2019-08-07 MED ORDER — FENTANYL CITRATE (PF) 100 MCG/2ML IJ SOLN
INTRAMUSCULAR | Status: DC | PRN
  Administered 2019-08-07: 25 ug via INTRAVENOUS

## 2019-08-07 MED ORDER — PHENYLEPHRINE HCL (PRESSORS) 10 MG/ML IV SOLN
INTRAVENOUS | Status: DC | PRN
  Administered 2019-08-07: 200 ug via INTRAVENOUS
  Administered 2019-08-07 (×2): 100 ug via INTRAVENOUS
  Administered 2019-08-07: 200 ug via INTRAVENOUS

## 2019-08-07 MED ORDER — LIDOCAINE 1 % OPTIME INJ - NO CHARGE
INTRAMUSCULAR | Status: DC | PRN
  Administered 2019-08-07: 20 mL

## 2019-08-07 MED ORDER — PROPOFOL 500 MG/50ML IV EMUL
INTRAVENOUS | Status: DC | PRN
  Administered 2019-08-07: 50 ug/kg/min via INTRAVENOUS

## 2019-08-07 MED ORDER — PHENYLEPHRINE HCL (PRESSORS) 10 MG/ML IV SOLN
INTRAVENOUS | Status: AC
  Filled 2019-08-07: qty 1

## 2019-08-07 MED ORDER — PROPOFOL 10 MG/ML IV BOLUS
INTRAVENOUS | Status: AC
  Filled 2019-08-07: qty 20

## 2019-08-07 MED ORDER — CEPHALEXIN 250 MG PO CAPS: 500.0000 mg | ORAL_CAPSULE | Freq: Two times a day (BID) | ORAL | 0 refills | Status: DC

## 2019-08-07 MED ORDER — FENTANYL CITRATE (PF) 100 MCG/2ML IJ SOLN: 25.0000 ug | INTRAMUSCULAR | Status: DC | PRN

## 2019-08-07 MED ORDER — CEFAZOLIN SODIUM-DEXTROSE 2-4 GM/100ML-% IV SOLN
INTRAVENOUS | Status: AC
  Filled 2019-08-07: qty 100

## 2019-08-07 MED ORDER — FENTANYL CITRATE (PF) 100 MCG/2ML IJ SOLN
INTRAMUSCULAR | Status: AC
  Filled 2019-08-07: qty 2

## 2019-08-07 MED ORDER — ONDANSETRON HCL 4 MG/2ML IJ SOLN: 4.0000 mg | Freq: Once | INTRAMUSCULAR | Status: DC | PRN

## 2019-08-07 MED ORDER — SODIUM CHLORIDE 0.9 % IV SOLN
INTRAVENOUS | Status: DC | PRN
  Administered 2019-08-07: 200 mL

## 2019-08-07 MED ORDER — GENTAMICIN SULFATE 40 MG/ML IJ SOLN
80.0000 mg | INTRAMUSCULAR | Status: DC
  Filled 2019-08-07: qty 2

## 2019-08-07 MED ORDER — SODIUM CHLORIDE 0.9 % IV SOLN: INTRAVENOUS | Status: DC

## 2019-08-07 MED ORDER — DEXMEDETOMIDINE HCL 200 MCG/2ML IV SOLN
INTRAVENOUS | Status: DC | PRN
  Administered 2019-08-07 (×2): 8 ug via INTRAVENOUS
  Administered 2019-08-07: 4 ug via INTRAVENOUS

## 2019-08-07 MED ORDER — ONDANSETRON HCL 4 MG/2ML IJ SOLN
INTRAMUSCULAR | Status: AC
  Filled 2019-08-07: qty 2

## 2019-08-07 SURGICAL SUPPLY — 39 items
BAG DECANTER FOR FLEXI CONT (MISCELLANEOUS) ×3 IMPLANT
BLADE PHOTON ILLUMINATED (MISCELLANEOUS) ×3 IMPLANT
BRUSH SCRUB EZ  4% CHG (MISCELLANEOUS) ×2
BRUSH SCRUB EZ 4% CHG (MISCELLANEOUS) ×1 IMPLANT
CABLE SURG 12 DISP A/V CHANNEL (MISCELLANEOUS) ×3 IMPLANT
CANISTER SUCT 1200ML W/VALVE (MISCELLANEOUS) ×3 IMPLANT
CHLORAPREP W/TINT 26 (MISCELLANEOUS) ×3 IMPLANT
COVER LIGHT HANDLE STERIS (MISCELLANEOUS) ×6 IMPLANT
COVER MAYO STAND REUSABLE (DRAPES) ×3 IMPLANT
COVER WAND RF STERILE (DRAPES) ×3 IMPLANT
DRAPE C-ARM XRAY 36X54 (DRAPES) IMPLANT
DRSG TEGADERM 4X4.75 (GAUZE/BANDAGES/DRESSINGS) ×3 IMPLANT
DRSG TELFA 4X3 1S NADH ST (GAUZE/BANDAGES/DRESSINGS) ×3 IMPLANT
ELECT REM PT RETURN 9FT ADLT (ELECTROSURGICAL) ×3
ELECTRODE REM PT RTRN 9FT ADLT (ELECTROSURGICAL) ×1 IMPLANT
GLIDEWIRE STIFF .35X180X3 HYDR (WIRE) IMPLANT
GLOVE BIO SURGEON STRL SZ7.5 (GLOVE) ×3 IMPLANT
GLOVE BIO SURGEON STRL SZ8 (GLOVE) ×3 IMPLANT
GOWN STRL REUS W/ TWL LRG LVL3 (GOWN DISPOSABLE) ×1 IMPLANT
GOWN STRL REUS W/ TWL XL LVL3 (GOWN DISPOSABLE) ×1 IMPLANT
GOWN STRL REUS W/TWL LRG LVL3 (GOWN DISPOSABLE) ×2
GOWN STRL REUS W/TWL XL LVL3 (GOWN DISPOSABLE) ×2
IMMOBILIZER SHDR MD LX WHT (SOFTGOODS) IMPLANT
IMMOBILIZER SHDR XL LX WHT (SOFTGOODS) IMPLANT
INTRO PACEMAKR LEAD 9FR 13CM (INTRODUCER)
INTRO PACEMKR SHEATH II 7FR (MISCELLANEOUS)
INTRODUCER PACEMKR LD 9FR 13CM (INTRODUCER) IMPLANT
INTRODUCER PACEMKR SHTH II 7FR (MISCELLANEOUS) IMPLANT
IPG PACE AZUR XT DR MRI W1DR01 (Pacemaker) ×1 IMPLANT
IV NS 500ML (IV SOLUTION) ×2
IV NS 500ML BAXH (IV SOLUTION) ×1 IMPLANT
KIT TURNOVER KIT A (KITS) ×3 IMPLANT
KIT WRENCH (KITS) ×3 IMPLANT
LABEL OR SOLS (LABEL) ×3 IMPLANT
MARKER SKIN DUAL TIP RULER LAB (MISCELLANEOUS) ×3 IMPLANT
PACE AZURE XT DR MRI W1DR01 (Pacemaker) ×3 IMPLANT
PACK PACE INSERTION (MISCELLANEOUS) ×3 IMPLANT
PAD ONESTEP ZOLL R SERIES ADT (MISCELLANEOUS) ×3 IMPLANT
SUT SILK 0 SH 30 (SUTURE) ×9 IMPLANT

## 2019-08-07 NOTE — Interval H&P Note (Signed)
No clinical change, pacemaker ERI

## 2019-08-07 NOTE — Op Note (Signed)
[facility] Cardiology   08/07/2019                     1:34 PM  PATIENT:  Cole Iha Sr.    PRE-OPERATIVE DIAGNOSIS:  battery end of life  POST-OPERATIVE DIAGNOSIS:  Same  PROCEDURE:  PACEMAKER CHANGE OUT  SURGEON:  Isaias Cowman, MD    ANESTHESIA:     PREOPERATIVE INDICATIONS:  Cole Schearer. is a  79 y.o. male with a diagnosis of battery end of life who failed conservative measures and elected for surgical management.    The risks benefits and alternatives were discussed with the patient preoperatively including but not limited to the risks of infection, bleeding, cardiopulmonary complications, the need for revision surgery, among others, and the patient was willing to proceed.   OPERATIVE PROCEDURE: The patient was brought to the operating room in a fasting state.  The left pectoral region was prepped and draped in the usual sterile manner.  Anesthesia was obtained 1% lidocaine locally.  A 6 cm incision was performed of left pectoral region.  The existing pacemaker generator was retrieved by electrocautery and blunt dissection.  The leads were disconnected and connected to a new dual-chamber rate responsive pacemaker generator ( [employer] Blaine DR [id number] ). The pacemaker pocket was irrigated with gentamicin solution.  The new pacemaker generator was positioned into the pocket and the pocket was closed with 2-0 and 4-0 Vicryl, respectively.  Steri-Strips and a pressure dressing were applied.  Postprocedural interrogation revealed appropriate dual-chamber atrial and ventricular sensing and pacing thresholds.  There were no periprocedural complications.

## 2019-08-07 NOTE — H&P (Signed)
Jump to Section ? Document InformationECG ResultsEncounter DetailsGoalsLab ResultsLast Filed Vital SignsPatient ContactsPatient DemographicsPlan of TreatmentProceduresProgress NotesReason for ReferralReason for VisitSocial HistoryVisit Diagnoses Nena Jordan Jacquenette Shone Summary, generated on Jan. 26, 2021January 26, 2021 Printout Information  Document Contents Document Received Date Document Source Organization  Office Visit Jan. 26, 2021January 26, 2021 [place]   Patient Demographics - 79 y.o. Male; born [birth year]  Patient Address Communication Language Race / Ethnicity Marital Status  [place], [place] [address] [phone] [facility]) [phone] [facility]) [phone] (Work) [email or web address] English (Preferred) White / Not Hispanic or Latino Married  Reason for Referral  Procedure (Routine) Procedure (Routine)  Status Reason Specialty Diagnoses / Procedures Referred By Contact Referred To Contact  Pending Review   Diagnoses  Paroxysmal atrial fibrillation (CMS-HCC)    Procedures  ECG 12-lead  Flossie Dibble, MD  [address]  [facility]  [place], [place] [address]  Phone: [phone]  Fax: [phone]        Reason for Visit  Reason Comments  Pacer-ICD   Hypertension    Encounter Details  Date Type Department Care Team Description  07/15/2019 Office Visit [facility]  [place], [place] [address]  [phone]  Flossie Dibble, [place]  [facility]  [place], [place] [address]  [phone]  [phone] (Fax)  Paroxysmal atrial fibrillation (CMS-HCC) (Primary Dx);  Atherosclerosis of both carotid arteries;  Benign essential hypertension;  Complete heart block (CMS-HCC);  Hyperlipidemia, mixed;  Congestive cardiomyopathy (CMS-HCC);  Pre-op testing   Social History - documented as of  this encounter Tobacco Use Types Packs/Day Years Used Date  Former Smoker  2 30 07/12/1959 - 07/11/1989  Smokeless Tobacco: Never Used      Alcohol Use Drinks/Week oz/Week Comments  No 0 Standard drinks or equivalent  0.0    Sex Assigned at Birth Date Recorded  Not on file    COVID-19 Exposure Response Date Recorded  In the last month, have you been in contact with someone who was confirmed or suspected to have [place] / COVID-19? No / Unsure 07/15/2019 9:01 AM EST   Last Filed Vital Signs - documented in this encounter Vital Sign Reading Time Taken Comments  Blood Pressure 130/80 07/15/2019 9:25 AM EST   Pulse 65 07/15/2019 9:25 AM EST   Temperature - -   Respiratory Rate - -   Oxygen Saturation 97% 07/15/2019 9:25 AM EST   Inhaled Oxygen Concentration - -   Weight 75.8 kg (167 lb) 07/15/2019 9:25 AM EST   Height 165.1 cm (_1 ) 07/15/2019 9:25 AM EST   Body Mass Index 27.79 07/15/2019 9:25 AM EST    Progress Notes - documented in this encounter Flossie Dibble, MD - 07/15/2019 9:30 AM EST Formatting of this note might be different from the original. Established Patient Visit   Chief Complaint: Chief Complaint  Patient presents with  . Pacer-ICD  . Hypertension  Date of Service: 07/15/2019 Date of Birth: Dec 24, 1940 PCP: Barnabas Lister, MD  History of Present Illness: Mr. Faries is a 79 y.o.male patient  Pacemaker Interrogation The patient has had a pacemaker interrogation which has shown that the pacemaker battery has reached end of life. We have had further discussion of battery change out with all of the risks and benefits. Otherwise the patient has not had any significant side effects or symptoms of the pacemaker wires. Essential hypertension The patient currently has a  diagnosis of essential hypertension with no evidence of secondary causes of high blood pressure at this time. The patient has been on appropriate medication management without  current evidence of significant side effects. This regimen is for risk reduction of cardiovascular disease and possible future complication. The blood pressure appears stable today. We have discussed treatment goals and current guidelines for hypertension therapy. The patient understands risks and benefits of medication management and risk factor modification. They agree to continuation of this current medical regimen. Mixed Hyperlipidemia The patient has cardiovascular risk factors including Diabetes, High LDL cholesterol and greater than 7.5% 10 year cardiovascular risk score and therefore has been placed on High intensity therapy with rosuvastatin (Crestor) for reduction in LDL and cardiovascular risk for which we have discussed today. Other healthy lifestyle measures have been discussed as well. We have discussed the appropriate treatment goals of the above measures which include a 30% to 50% reduction in LDL levels. A significant component of residual cardiovascular risk is in adequate LDL C lowering in the large majority of patients. Additionally ultralow LDL-C is safe with no apparent downside. The patient has a clear understanding of reasons for lipid management at this time. The patient is currently having no evidence of significant side effects of this medication at this time. Now ldl 56 Peripheral Vascular Disease The patient has carotid artery disease without complications over the last months. Causes of the disease include diabetes, high blood pressure and high cholesterol with current treatment including daily ASA, Lipid lowering medication and Blood pressure treatment. The patient has not had recent signs and/or symptoms of progression of disease and is currently stable. We have discussed focus on treatment of contributing disease processes including Diabetes Type II , Hypertension and Peripheral vascular disease   Past Medical and Surgical History  Past Medical History Past Medical History:    Diagnosis Date  . Colon polyp  . Complete heart block (CMS-HCC)  . GERD (gastroesophageal reflux disease)  . History of colonic polyps  . History of lip cancer  . Hyperlipidemia  . Hypertension  . Non-insulin dependent type 2 diabetes mellitus (CMS-HCC)  . PVD (peripheral vascular disease) (CMS-HCC)   Past Surgical History He has a past surgical history that includes Insert / replace / remove pacemaker and Colonoscopy (02/12/2018).   Medications and Allergies  Current Medications  Current Outpatient Medications on File Prior to Visit  Medication Sig Dispense Refill  . ACCU-CHEK AVIVA PLUS TEST STRP test strip USE ONCE DAILY USE AS INSTRUCTED. 100 strip 3  . amLODIPine (NORVASC) 5 MG tablet Take 1 tablet (5 mg total) by mouth once daily 90 tablet 1  . aspirin 81 MG EC tablet Take 81 mg by mouth once daily.  . IDS Study Medication 1 each  . loratadine (CLARITIN) 10 mg tablet Take 10 mg by mouth once daily.  Marland Kitchen losartan-hydrochlorothiazide (HYZAAR) 100-25 mg tablet TAKE 1 TABLET BY MOUTH EVERY DAY 90 tablet 1  . lutein 20 mg Tab Take 20 mg by mouth once daily.  . metFORMIN (GLUCOPHAGE) 1000 MG tablet TAKE 1 TABLET BY MOUTH TWICE A DAY WITH MEALS 180 tablet 1  . multivitamin tablet Take 1 tablet by mouth once daily.  . rosuvastatin (CRESTOR) 20 MG tablet TAKE 1 TABLET BY MOUTH EVERY DAY 90 tablet 1   No current facility-administered medications on file prior to visit.   Allergies: Advicor [niacin-lovastatin], Niacin, Zetia [ezetimibe], and Zocor [simvastatin]  Social and Family History  Social History reports that he quit smoking  about 30 years ago. He started smoking about 60 years ago. He has a 60.00 pack-year smoking history. He has never used smokeless tobacco. He reports that he does not drink alcohol or use drugs.  Family History Family History  Problem Relation Age of Onset  . Heart block Brother  w/ PPM  . Myocardial Infarction (Heart attack) Mother  . Myocardial  Infarction (Heart attack) Father   Review of Systems   Review of Systems  Positive for none Negative for weight gain weight loss, weakness, vision change, hearing loss, cough, congestion, PND, orthopnea, heartburn, nausea, diaphoresis, vomiting, diarrhea, bloody stool, melena, stomach pain, extremity pain, leg weakness, leg cramping, leg blood clots, headache, blackouts, nosebleed, trouble swallowing, mouth pain, urinary frequency, urination at night, muscle weakness, skin lesions, skin rashes, tingling ,ulcers, numbness, anxiety, and/or depression Physical Examination   Vitals:BP 130/80  Pulse 65  Ht 165.1 cm (_0 )  Wt 75.8 kg (167 lb)  SpO2 97%  BMI 27.79 kg/m  Ht:165.1 cm (_1 ) Wt:75.8 kg (167 lb) ZOX:WRUE surface area is 1.86 meters squared. Body mass index is 27.79 kg/m. Appearance: well appearing in no acute distress HEENT: Pupils equally reactive to light and accomodation, no xanthalasma  Neck: Supple, no apparent thyromegaly, masses, or lymphadenopathy  Lungs: normal respiratory effort; no crackles, no rhonchi, no wheezes Heart: Regular rate and rhythm. Normal S1 S2 No gallops, murmur, no rub, PMI is normal size and placement. carotid upstroke normal with bruit. Jugular venous pressure is normal Abdomen: soft, nontender, not distended with normal bowel sounds. No apparent hepatosplenomegally. Abdominal aorta is normal size without bruit Extremities: no edema, no ulcers, no clubbing, no cyanosis Peripheral Pulses: 2+ in upper extremities, 2+ femoral pulses bilaterally, 2+lower extremity  Musculoskeletal; Normal muscle tone without kyphosis Neurological: Oriented and Alert, Cranial nerves intact  Assessment   79 y.o. male with  Encounter Diagnoses  Name Primary?  . Paroxysmal atrial fibrillation (CMS-HCC) Yes  . Atherosclerosis of both carotid arteries  . Benign essential hypertension  . Complete heart block (CMS-HCC)  . Hyperlipidemia, mixed   Plan  -the patient  will have a consultation and or surgical treatment for recent pacemaker interrogation showing the battery to be end of life. The patient will need a pacemaker battery change out. Patient understands all risks and benefits of battery change out. This includes the possibility of death, stroke, heart attack, infection, bleeding, blood clot, and or side effects of medication management for anesthesia and agrees to proceed -Hypertension medication management listed above has been reviewed and discussed with the patient. We will continue current medical regimen at this time for reduction of risk of cardiovascular disease. The patient will report any new or future change of blood pressure for need in potential treatment changes. We have recommended home blood pressure monitoring if able. -We have discussed risk reduction in the cardiovascular disease process by a continuation of lipid management with the current medication management for lipid reduction. The goals continue to be 30-50% lowering of LDL cholesterol in addition to lifestyle measures. This will include diet and improved activity level on a regular basis.The patient has an understanding of this discussion at this time and we will continue the appropriate strategy. -We have had a long discussion about the benefits of physical and occupational rehabilitation. The patient is advised and encouraged to enroll for improvements in quality of life and reduced hospitalization.  Orders Placed This Encounter  Procedures  . ECG 12-lead   Return in about 4 weeks (around  08/12/2019).  Flossie Dibble, MD    Electronically signed by Flossie Dibble, MD at 07/15/2019 9:38 AM EST   Plan of Treatment - documented as of this encounter Upcoming Encounters Upcoming Encounters  Date Type Specialty Care Team Description  09/24/2019 Procedure visit Cardiology Flossie Dibble, [place]  [facility]  [place],  [place] [address]  [phone]  [phone] (Fax)    09/26/2019 Ancillary Orders Lab Barnabas Lister, MD  [phone] S. [facility]  [place], [place] [address]  [phone]  [phone] (Fax)    10/03/2019 Office Visit Internal Medicine Baldemar Lenis, Jacqulyn Liner, MD  [phone] S. [facility] and Internal Medicine  [place], [place] [address]  [phone]  [phone] (Fax)    10/24/2019 Office Visit Cardiology Flossie Dibble, MD  [address]  [facility]  [place], [place] [address]  [phone]  [phone] (Fax)     Goals - documented as of this encounter Goal Patient Goal Type Associated Problems Recent Progress Patient-Stated? Author  Weight (lb) < 165  Weight  On track (04/05/2019 1:26 PM EDT) Yes Babaoff, Jacqulyn Liner, MD   Procedures - documented in this encounter Procedure Name Priority Date/Time Associated Diagnosis Comments  CBC W/AUTO DIFFERENTIAL (5 PART DIFF) -[facility] Routine 07/15/2019 10:33 AM EST Pre-op testing  Results for this procedure are in the results section.   BASIC METABOLIC PANEL (BMP) - [facility] AFFILI[facility], [facility] Routine 07/15/2019 10:33 AM EST Pre-op testing  Results for this procedure are in the results section.   PR ELECTROCARDIOGRAM, COMPLETE Routine 07/15/2019 9:23 AM EST Paroxysmal atrial fibrillation (CMS-HCC)  Results for this procedure are in the results section.    Lab Results - documented in this encounter Table of Contents for Lab Results  CBC w/auto Differential (5 Part) (07/15/2019 10:33 AM EST)  Basic Metabolic Panel (BMP) (82/42/3536 10:33 AM EST)     CBC w/auto Differential (5 Part) (07/15/2019 10:33 AM EST) CBC w/auto Differential (5 Part) (07/15/2019 10:33 AM EST)  Component Value Ref Range Performed At Pathologist Signature  WBC (White Blood Cell Count) 9.1 4.1 - 10.2 10^3/uL [facility]   RBC (Red Blood Cell Count) 5.10 4.69 - 6.13 10^6/uL [facility]   Hemoglobin 14.9 14.1 - 18.1 gm/dL [facility]   Hematocrit 46.7 40.0 - 52.0 % [facility]   MCV (Mean Corpuscular Volume) 91.6 80.0 - 100.0 fl [facility]   MCH (Mean Corpuscular Hemoglobin) 29.2 27.0 - 31.2 pg [facility]   MCHC (Mean Corpuscular Hemoglobin Concentration) 31.9 (L) 32.0 - 36.0 gm/dL [facility]   Platelet Count 174 150 - 450 10^3/uL [facility]   RDW-CV (Red Cell Distribution Width) 14.0 11.6 - 14.8 % [facility]   MPV (Mean Platelet Volume) Comment: Unable to report.   [place] - [facility]   Neutrophils 5.28 1.50 - 7.80 10^3/uL [place] [facility]   Lymphocytes 2.21 1.00 - 3.60 10^3/uL [place] - [facility]   Monocytes 1.29 0.00 - 1.50 10^3/uL [facility]   Eosinophils 0.19 0.00 - 0.55 10^3/uL [place] - [facility]   Basophils 0.06 0.00 - 0.09 10^3/uL [facility]   Neutrophil % 58.3 32.0 -  70.0 % [facility]   Lymphocyte % 24.4 10.0 - 50.0 % [facility]   Monocyte % 14.3 (H) 4.0 - 13.0 % [facility]   Eosinophil % 2.1 1.0 - 5.0 % [facility]   Basophil% 0.7 0.0 - 2.0 % [facility]   Immature Granulocyte % 0.2 <=0.7 % [facility]   Immature Granulocyte Count 0.02 <=0.06 10^3/L [place] - LAB    CBC w/auto Differential (5 Part) (07/15/2019 10:33 AM EST)  Specimen  Blood   CBC w/auto Differential (5 Part) (07/15/2019 10:33 AM EST)  Performing Organization Address City/State/Zipcode Phone Number  [place]  [place], [place] [phone]     Back to top of Lab Results    Basic Metabolic Panel (BMP) (97/98/9211 10:33 AM EST) Basic Metabolic Panel (BMP)  (94/17/4081 10:33 AM EST)  Component Value Ref Range Performed At Pathologist Signature  Glucose 145 (H) 70 - 110 mg/dL [facility]   Sodium 143 136 - 145 mmol/L [facility]   Potassium 4.3 3.6 - 5.1 mmol/L [facility]   Chloride 106 97 - 109 mmol/L [facility]   Carbon Dioxide (CO2) 30.4 22.0 - 32.0 mmol/L [facility]   Calcium 10.1 8.7 - 10.3 mg/dL [facility]   Urea Nitrogen (BUN) 23 7 - 25 mg/dL [facility]   Creatinine 1.0 0.7 - 1.3 mg/dL [place] - [facility]   Glomerular Filtration Rate (eGFR), MDRD Estimate 72 >60 mL/min/1.73sq m [facility]   BUN/Crea Ratio 23.0 (H) 6.0 - 20.0 [facility]   Anion Gap w/K 10.9 6.0 - 16.0 [facility]    Basic Metabolic Panel (BMP) (44/81/8563 10:33 AM EST)  Specimen  Blood   Basic Metabolic Panel (BMP) (14/97/0263 10:33 AM EST)  Performing Organization Address City/State/Zipcode Phone Number  [place]  [place], [place] [phone]     Back to top of Lab Results  ECG Results - documented in this encounter  ECG 12-lead (07/15/2019 9:23 AM EST) ECG 12-lead (07/15/2019 9:23 AM EST)  Component Value Ref Range Performed At Pathologist Signature  Vent Rate (bpm) 65  [facility] GE MUSE RESULTS   QRS Interval (msec) 184  [facility] GE MUSE RESULTS   QT Interval (msec) 468  [facility] GE MUSE RESULTS   QTc (msec) 486  [facility] GE MUSE RESULTS    ECG 12-lead (07/15/2019 9:23 AM EST)  Specimen     ECG 12-lead (07/15/2019 9:23 AM EST)  Narrative Performed At  This result has an attachment that is not available.  Sinus rhythm with complete heart block and Ventricular-paced rhythm Abnormal ECG No previous ECGs available I reviewed and concur with this report. Electronically signed XA:JOINOMVE MD, Darnell Level [id number]) on 07/24/2019 1:41:29 PM   [facility] GE MUSE  RESULTS    ECG 12-lead (07/15/2019 9:23 AM EST)  Performing Organization Address City/State/Zipcode Phone Number  [place]        Visit Diagnoses - documented in this encounter Diagnosis  Paroxysmal atrial fibrillation (CMS-HCC) - Primary  Atrial fibrillation   Atherosclerosis of both carotid arteries   Benign essential hypertension  Essential hypertension, benign   Complete heart  block (CMS-HCC)  Atrioventricular block, complete   Hyperlipidemia, mixed  Mixed hyperlipidemia   Congestive cardiomyopathy (CMS-HCC)  Other primary cardiomyopathies   Pre-op testing  Unspecified pre-operative examination   Images  Patient Contacts  Contact Name Contact Address Communication Relationship to Patient  Chai Routh Unknown [phone] [facility]) Spouse, Emergency Contact  Document Information  Primary Care Provider Other Service Providers Document Coverage Dates  Barnabas Lister, MD (Apr. 22, 2015April 22, 2015 - Present) [phone] (Work) [phone] (Fax) [address] [place] and Internal Medicine [place], [place] [address] [place] [address] [place], [place] [address] Kingsley Callander, RN (Registered Nurse)  [facility] [place], [place] [address] Jan. 04, 2021January 04, 2021   [place] [address] [place], [place] [address]   Encounter Providers Encounter Date  Flossie Dibble, MD (Attending) [phone] (Work) [phone] (Fax) [address] [facility] [place], [place] [address] Cardiovascular Disease Jan. 04, 2021January 04, 2021 - Jan. 13, 2021January 13, 2021    Show All Sections

## 2019-08-07 NOTE — Discharge Instructions (Signed)
May remove outer bandage on 08/08/2019.  Leave Steri-Strips on.  May shower on 08/08/2019.   AMBULATORY SURGERY  DISCHARGE INSTRUCTIONS   1) The drugs that you were given will stay in your system until tomorrow so for the next 24 hours you should not:  A) Drive an automobile B) Make any legal decisions C) Drink any alcoholic beverage   2) You may resume regular meals tomorrow.  Today it is better to start with liquids and gradually work up to solid foods.  You may eat anything you prefer, but it is better to start with liquids, then soup and crackers, and gradually work up to solid foods.   3) Please notify your doctor immediately if you have any unusual bleeding, trouble breathing, redness and pain at the surgery site, drainage, fever, or pain not relieved by medication.    4) Additional Instructions:   Please contact your physician with any problems or Same Day Surgery at [phone], Monday through Friday 6 am to 4 pm, or [place] at [facility] number at [phone].

## 2019-08-07 NOTE — Transfer of Care (Signed)
Immediate Anesthesia Transfer of Care Note  Patient: Cole Jordan Klang Sr.  Procedure(s) Performed: PACEMAKER CHANGE OUT (N/A )  Patient Location: PACU  Anesthesia Type:General  Level of Consciousness: awake and alert   Airway & Oxygen Therapy: Patient Spontanous Breathing and Patient connected to nasal cannula oxygen  Post-op Assessment: Report given to RN and Post -op Vital signs reviewed and stable  Post vital signs: Reviewed and stable  Last Vitals:  Vitals Value Taken Time  BP    Temp    Pulse    Resp    SpO2      Last Pain:  Vitals:   08/07/19 1054  TempSrc: Tympanic  PainSc: 0-No pain         Complications: No apparent anesthesia complications

## 2019-08-07 NOTE — Anesthesia Postprocedure Evaluation (Signed)
Anesthesia Post Note  Patient: Cole Mccarty  Procedure(s) Performed: PACEMAKER CHANGE OUT (N/A )  Patient location during evaluation: PACU Anesthesia Type: General Level of consciousness: awake and alert Pain management: pain level controlled Vital Signs Assessment: post-procedure vital signs reviewed and stable Respiratory status: spontaneous breathing and respiratory function stable Cardiovascular status: stable Anesthetic complications: no     Last Vitals:  Vitals:   08/07/19 1431 08/07/19 1458  BP: 130/62 132/65  Pulse: 69 64  Resp: 16 16  Temp: (!) 36.1 C (!) 36.2 C  SpO2: 100% 100%    Last Pain:  Vitals:   08/07/19 1458  TempSrc: Tympanic  PainSc: 0-No pain                 Adylene Dlugosz K

## 2019-08-07 NOTE — Anesthesia Preprocedure Evaluation (Signed)
Anesthesia Evaluation  Patient identified by MRN, date of birth, ID band Patient awake    Reviewed: Allergy & Precautions, NPO status , Patient's Chart, lab work & pertinent test results  History of Anesthesia Complications Negative for: history of anesthetic complications  Airway Mallampati: III       Dental   Pulmonary neg sleep apnea, neg COPD, Not current smoker, former smoker,           Cardiovascular hypertension, + dysrhythmias (-) pacemaker     Neuro/Psych neg Seizures    GI/Hepatic Neg liver ROS, GERD  Medicated and Controlled,  Endo/Other  diabetes, Type 2, Oral Hypoglycemic Agents  Renal/GU negative Renal ROS     Musculoskeletal   Abdominal   Peds  Hematology   Anesthesia Other Findings   Reproductive/Obstetrics                            Anesthesia Physical Anesthesia Plan  ASA: III  Anesthesia Plan: General   Post-op Pain Management:    Induction: Intravenous  PONV Risk Score and Plan: 2 and Propofol infusion and TIVA  Airway Management Planned: Nasal Cannula  Additional Equipment:   Intra-op Plan:   Post-operative Plan:   Informed Consent: I have reviewed the patients History and Physical, chart, labs and discussed the procedure including the risks, benefits and alternatives for the proposed anesthesia with the patient or authorized representative who has indicated his/her understanding and acceptance.       Plan Discussed with:   Anesthesia Plan Comments:         Anesthesia Quick Evaluation

## 2019-08-07 NOTE — OR Nursing (Signed)
This RN spoke with MD Paraschos , per MD pt can restart Asprin tomorrow 08/08/19

## 2019-08-07 NOTE — OR Nursing (Signed)
Discharge teaching went over with pt and spouse, pt in NAD. VS. PT and family verbalize d/c understanding

## 2020-10-28 ENCOUNTER — Emergency Department: Payer: Medicare PPO

## 2020-10-28 ENCOUNTER — Inpatient Hospital Stay
Admission: EM | Admit: 2020-10-28 | Discharge: 2020-10-30 | DRG: 871 | Disposition: A | Discharge status: Home Health | Payer: Medicare PPO | Attending: Internal Medicine | Admitting: Internal Medicine

## 2020-10-28 ENCOUNTER — Encounter: Payer: Self-pay | Admitting: Emergency Medicine

## 2020-10-28 ENCOUNTER — Other Ambulatory Visit: Payer: Self-pay

## 2020-10-28 DIAGNOSIS — W19XXXA Unspecified fall, initial encounter: Secondary | ICD-10-CM | POA: Diagnosis present

## 2020-10-28 DIAGNOSIS — K056 Periodontal disease, unspecified: Secondary | ICD-10-CM | POA: Diagnosis present

## 2020-10-28 DIAGNOSIS — Z950 Presence of cardiac pacemaker: Secondary | ICD-10-CM

## 2020-10-28 DIAGNOSIS — Z888 Allergy status to other drugs, medicaments and biological substances status: Secondary | ICD-10-CM

## 2020-10-28 DIAGNOSIS — Z7984 Long term (current) use of oral hypoglycemic drugs: Secondary | ICD-10-CM

## 2020-10-28 DIAGNOSIS — Q631 Lobulated, fused and horseshoe kidney: Secondary | ICD-10-CM

## 2020-10-28 DIAGNOSIS — N401 Enlarged prostate with lower urinary tract symptoms: Secondary | ICD-10-CM | POA: Diagnosis present

## 2020-10-28 DIAGNOSIS — Z20822 Contact with and (suspected) exposure to covid-19: Secondary | ICD-10-CM | POA: Diagnosis present

## 2020-10-28 DIAGNOSIS — E119 Type 2 diabetes mellitus without complications: Secondary | ICD-10-CM

## 2020-10-28 DIAGNOSIS — K029 Dental caries, unspecified: Secondary | ICD-10-CM

## 2020-10-28 DIAGNOSIS — I10 Essential (primary) hypertension: Secondary | ICD-10-CM | POA: Diagnosis present

## 2020-10-28 DIAGNOSIS — R338 Other retention of urine: Secondary | ICD-10-CM | POA: Diagnosis present

## 2020-10-28 DIAGNOSIS — H9190 Unspecified hearing loss, unspecified ear: Secondary | ICD-10-CM | POA: Diagnosis present

## 2020-10-28 DIAGNOSIS — F0390 Unspecified dementia without behavioral disturbance: Secondary | ICD-10-CM | POA: Diagnosis present

## 2020-10-28 DIAGNOSIS — G309 Alzheimer's disease, unspecified: Secondary | ICD-10-CM | POA: Diagnosis not present

## 2020-10-28 DIAGNOSIS — E785 Hyperlipidemia, unspecified: Secondary | ICD-10-CM | POA: Diagnosis present

## 2020-10-28 DIAGNOSIS — I442 Atrioventricular block, complete: Secondary | ICD-10-CM | POA: Diagnosis present

## 2020-10-28 DIAGNOSIS — E1151 Type 2 diabetes mellitus with diabetic peripheral angiopathy without gangrene: Secondary | ICD-10-CM | POA: Diagnosis present

## 2020-10-28 DIAGNOSIS — Z85819 Personal history of malignant neoplasm of unspecified site of lip, oral cavity, and pharynx: Secondary | ICD-10-CM | POA: Diagnosis not present

## 2020-10-28 DIAGNOSIS — R531 Weakness: Secondary | ICD-10-CM

## 2020-10-28 DIAGNOSIS — Z7982 Long term (current) use of aspirin: Secondary | ICD-10-CM | POA: Diagnosis not present

## 2020-10-28 DIAGNOSIS — A419 Sepsis, unspecified organism: Secondary | ICD-10-CM | POA: Diagnosis present

## 2020-10-28 DIAGNOSIS — K219 Gastro-esophageal reflux disease without esophagitis: Secondary | ICD-10-CM | POA: Diagnosis present

## 2020-10-28 DIAGNOSIS — Z87891 Personal history of nicotine dependence: Secondary | ICD-10-CM

## 2020-10-28 DIAGNOSIS — G9341 Metabolic encephalopathy: Secondary | ICD-10-CM | POA: Diagnosis present

## 2020-10-28 DIAGNOSIS — Z79899 Other long term (current) drug therapy: Secondary | ICD-10-CM

## 2020-10-28 DIAGNOSIS — F0280 Dementia in other diseases classified elsewhere without behavioral disturbance: Secondary | ICD-10-CM | POA: Diagnosis not present

## 2020-10-28 LAB — COMPREHENSIVE METABOLIC PANEL
ALT: 16 U/L (ref 0–44)
AST: 19 U/L (ref 15–41)
Albumin: 4.7 g/dL (ref 3.5–5.0)
Alkaline Phosphatase: 54 U/L (ref 38–126)
Anion gap: 13 (ref 5–15)
BUN: 20 mg/dL (ref 8–23)
CO2: 25 mmol/L (ref 22–32)
Calcium: 9.7 mg/dL (ref 8.9–10.3)
Chloride: 100 mmol/L (ref 98–111)
Creatinine, Ser: 1.11 mg/dL (ref 0.61–1.24)
GFR, Estimated: >60 mL/min (ref >60)
Glucose, Bld: 113 mg/dL — ABNORMAL HIGH (ref 70–99)
Potassium: 3.5 mmol/L (ref 3.5–5.1)
Sodium: 138 mmol/L (ref 135–145)
Total Bilirubin: 1.7 mg/dL — ABNORMAL HIGH (ref 0.3–1.2)
Total Protein: 8.1 g/dL (ref 6.5–8.1)

## 2020-10-28 LAB — CBC WITH DIFFERENTIAL/PLATELET
Abs Immature Granulocytes: 0.06 10*3/uL (ref 0.00–0.07)
Basophils Absolute: 0 10*3/uL (ref 0.0–0.1)
Basophils Relative: 0 %
Eosinophils Absolute: 0 10*3/uL (ref 0.0–0.5)
Eosinophils Relative: 0 %
HCT: 47.3 % (ref 39.0–52.0)
Hemoglobin: 15.8 g/dL (ref 13.0–17.0)
Immature Granulocytes: 1 %
Lymphocytes Relative: 10 %
Lymphs Abs: 1.2 10*3/uL (ref 0.7–4.0)
MCH: 29.8 pg (ref 26.0–34.0)
MCHC: 33.4 g/dL (ref 30.0–36.0)
MCV: 89.2 fL (ref 80.0–100.0)
Monocytes Absolute: 2.1 10*3/uL — ABNORMAL HIGH (ref 0.1–1.0)
Monocytes Relative: 17 %
Neutro Abs: 8.9 10*3/uL — ABNORMAL HIGH (ref 1.7–7.7)
Neutrophils Relative %: 72 %
Platelets: 197 10*3/uL (ref 150–400)
RBC: 5.3 MIL/uL (ref 4.22–5.81)
RDW: 14 % (ref 11.5–15.5)
WBC: 12.3 10*3/uL — ABNORMAL HIGH (ref 4.0–10.5)
nRBC: 0 % (ref 0.0–0.2)

## 2020-10-28 LAB — URINALYSIS, COMPLETE (UACMP) WITH MICROSCOPIC
Bacteria, UA: NONE SEEN
Bilirubin Urine: NEGATIVE
Glucose, UA: NEGATIVE mg/dL
Ketones, ur: NEGATIVE mg/dL
Leukocytes,Ua: NEGATIVE
Nitrite: NEGATIVE
Protein, ur: NEGATIVE mg/dL
Specific Gravity, Urine: 1.011 (ref 1.005–1.030)
Squamous Epithelial / HPF: NONE SEEN (ref 0–5)
pH: 5 (ref 5.0–8.0)

## 2020-10-28 LAB — RESP PANEL BY RT-PCR (FLU A&B, COVID) ARPGX2
Influenza A by PCR: NEGATIVE
Influenza B by PCR: NEGATIVE
SARS Coronavirus 2 by RT PCR: NEGATIVE

## 2020-10-28 LAB — PROTIME-INR
INR: 1.1 (ref 0.8–1.2)
Prothrombin Time: 13.7 seconds (ref 11.4–15.2)

## 2020-10-28 LAB — PROCALCITONIN: Procalcitonin: <0.1 ng/mL

## 2020-10-28 LAB — GLUCOSE, CAPILLARY: Glucose-Capillary: 126 mg/dL — ABNORMAL HIGH (ref 70–99)

## 2020-10-28 LAB — LACTIC ACID, PLASMA: Lactic Acid, Venous: 1.7 mmol/L (ref 0.5–1.9)

## 2020-10-28 MED ORDER — HYDROXYZINE HCL 50 MG/ML IM SOLN
25.0000 mg | Freq: Four times a day (QID) | INTRAMUSCULAR | Status: DC | PRN
  Filled 2020-10-28: qty 0.5

## 2020-10-28 MED ORDER — ACETAMINOPHEN 500 MG PO TABS
1000.0000 mg | ORAL_TABLET | Freq: Once | ORAL | Status: AC
  Administered 2020-10-28: 1000 mg via ORAL
  Filled 2020-10-28: qty 2

## 2020-10-28 MED ORDER — LACTATED RINGERS IV BOLUS (SEPSIS)
1000.0000 mL | Freq: Once | INTRAVENOUS | Status: AC
  Administered 2020-10-28: 1000 mL via INTRAVENOUS

## 2020-10-28 MED ORDER — SODIUM CHLORIDE 0.9 % IV SOLN
2.0000 g | Freq: Two times a day (BID) | INTRAVENOUS | Status: DC
  Administered 2020-10-28: 2 g via INTRAVENOUS
  Filled 2020-10-28 (×3): qty 2

## 2020-10-28 MED ORDER — INSULIN ASPART 100 UNIT/ML ~~LOC~~ SOLN
0.0000 [IU] | Freq: Every day | SUBCUTANEOUS | Status: DC
  Filled 2020-10-28: qty 1

## 2020-10-28 MED ORDER — ACETAMINOPHEN 325 MG PO TABS
650.0000 mg | ORAL_TABLET | Freq: Four times a day (QID) | ORAL | Status: DC | PRN
  Administered 2020-10-28 – 2020-10-29 (×2): 650 mg via ORAL
  Filled 2020-10-28 (×2): qty 2

## 2020-10-28 MED ORDER — VANCOMYCIN HCL 1500 MG/300ML IV SOLN
1500.0000 mg | Freq: Once | INTRAVENOUS | Status: AC
  Administered 2020-10-28: 1500 mg via INTRAVENOUS
  Filled 2020-10-28: qty 300

## 2020-10-28 MED ORDER — METRONIDAZOLE IN NACL 5-0.79 MG/ML-% IV SOLN
500.0000 mg | Freq: Once | INTRAVENOUS | Status: AC
  Administered 2020-10-28: 500 mg via INTRAVENOUS
  Filled 2020-10-28: qty 100

## 2020-10-28 MED ORDER — ADULT MULTIVITAMIN W/MINERALS CH
1.0000 | ORAL_TABLET | Freq: Every day | ORAL | Status: DC
  Administered 2020-10-29 – 2020-10-30 (×2): 1 via ORAL
  Filled 2020-10-28 (×2): qty 1

## 2020-10-28 MED ORDER — LORATADINE 10 MG PO TABS
10.0000 mg | ORAL_TABLET | Freq: Every day | ORAL | Status: DC
  Administered 2020-10-29 – 2020-10-30 (×2): 10 mg via ORAL
  Filled 2020-10-28 (×2): qty 1

## 2020-10-28 MED ORDER — VANCOMYCIN HCL IN DEXTROSE 1-5 GM/200ML-% IV SOLN
1000.0000 mg | INTRAVENOUS | Status: DC
  Filled 2020-10-28: qty 200

## 2020-10-28 MED ORDER — MEMANTINE HCL 5 MG PO TABS
10.0000 mg | ORAL_TABLET | Freq: Two times a day (BID) | ORAL | Status: DC
  Administered 2020-10-28 – 2020-10-30 (×4): 10 mg via ORAL
  Filled 2020-10-28 (×4): qty 2

## 2020-10-28 MED ORDER — SODIUM CHLORIDE 0.9 % IV SOLN
2.0000 g | Freq: Once | INTRAVENOUS | Status: AC
  Administered 2020-10-28: 2 g via INTRAVENOUS
  Filled 2020-10-28: qty 2

## 2020-10-28 MED ORDER — ACETAMINOPHEN 650 MG RE SUPP: 650.0000 mg | Freq: Four times a day (QID) | RECTAL | Status: DC | PRN

## 2020-10-28 MED ORDER — ROSUVASTATIN CALCIUM 20 MG PO TABS
20.0000 mg | ORAL_TABLET | Freq: Every day | ORAL | Status: DC
  Administered 2020-10-29 – 2020-10-30 (×2): 20 mg via ORAL
  Filled 2020-10-28 (×2): qty 1

## 2020-10-28 MED ORDER — LUTEIN 20 PO CAPS: 1.0000 | ORAL_CAPSULE | Freq: Every day | ORAL | Status: DC

## 2020-10-28 MED ORDER — HYDRALAZINE HCL 20 MG/ML IJ SOLN: 5.0000 mg | INTRAMUSCULAR | Status: DC | PRN

## 2020-10-28 MED ORDER — INSULIN ASPART 100 UNIT/ML ~~LOC~~ SOLN
0.0000 [IU] | Freq: Three times a day (TID) | SUBCUTANEOUS | Status: DC
  Administered 2020-10-28 – 2020-10-30 (×3): 1 [IU] via SUBCUTANEOUS
  Administered 2020-10-30: 2 [IU] via SUBCUTANEOUS
  Filled 2020-10-28 (×4): qty 1

## 2020-10-28 MED ORDER — METRONIDAZOLE IN NACL 5-0.79 MG/ML-% IV SOLN
500.0000 mg | Freq: Three times a day (TID) | INTRAVENOUS | Status: DC
  Administered 2020-10-28 – 2020-10-29 (×2): 500 mg via INTRAVENOUS
  Filled 2020-10-28 (×4): qty 100

## 2020-10-28 MED ORDER — VANCOMYCIN HCL IN DEXTROSE 1-5 GM/200ML-% IV SOLN
1000.0000 mg | Freq: Once | INTRAVENOUS | Status: DC
  Filled 2020-10-28: qty 200

## 2020-10-28 MED ORDER — ENOXAPARIN SODIUM 40 MG/0.4ML ~~LOC~~ SOLN
40.0000 mg | SUBCUTANEOUS | Status: DC
  Administered 2020-10-28 – 2020-10-29 (×2): 40 mg via SUBCUTANEOUS
  Filled 2020-10-28 (×2): qty 0.4

## 2020-10-28 MED ORDER — ASPIRIN EC 81 MG PO TBEC
81.0000 mg | DELAYED_RELEASE_TABLET | Freq: Every day | ORAL | Status: DC
  Administered 2020-10-29 – 2020-10-30 (×2): 81 mg via ORAL
  Filled 2020-10-28 (×2): qty 1

## 2020-10-28 NOTE — Sepsis Progress Note (Signed)
elink monitoring code sepsis.  

## 2020-10-28 NOTE — Consult Note (Signed)
Pharmacy Antibiotic Note  Joshiah Traynham. is a 80 y.o. male admitted on 10/28/2020 with sepsis.  Pharmacy has been consulted for vancomycin and cefepime dosing.  Plan: Vancomycin 1500 mg IV loading dose, followed by vancomycin 1000 mg IV q24h  Est AUC: 468.8 (SCr: 1.11; Vd: 0.72) Cefepime 2 g IV q12h Pt is also ordered Flagyl 500 mg IV q8h Monitor clinical picture, renal function, and vancomycin levels at steady state F/U C&S, abx deescalation / LOT  Height: 5\' 6"  (167.6 cm) Weight: 65 kg (143 lb 4.8 oz) IBW/kg (Calculated) : 63.8  Temp (24hrs), Avg:100.1 F (37.8 C), Min:99.6 F (37.6 C), Max:100.6 F (38.1 C)  Recent Labs  Lab 10/28/20 1200  WBC 12.3*  CREATININE 1.11  LATICACIDVEN 1.7    Estimated Creatinine Clearance: 48.7 mL/min (by C-G formula based on SCr of 1.11 mg/dL).    Allergies  Allergen Reactions  . Ezetimibe Other (See Comments)    unknown  . Niacin Other (See Comments)    Unknown  . Niacin-Lovastatin Er     Unknown  . Simvastatin     Unknown    Antimicrobials this admission: Vancomycin 4/20 >>  Cefepime 4/20 >>  Flagyl 4/20 >>  Dose adjustments this admission:  Microbiology results: 4/20 BCx: pending 4/20 UCx: pending   Thank you for allowing pharmacy to be a part of this patient's care.  Darnelle Bos, PharmD 10/28/2020 3:30 PM

## 2020-10-28 NOTE — ED Triage Notes (Signed)
Pt comes into the ED via POV c/o MAS, fever, and an unwitnessed fall.  When wife found him, he had fallen into the rod-iron gate in front of the fire place.  Pt does present with swelling to the chin.  Pt denies any LOC.  Pt is currently on does not take any blood thinners.  Pt ambulatory to exam room.  Pt has h./o dementia but per the wife "he is more confused than normal".

## 2020-10-28 NOTE — ED Notes (Signed)
BLADDER SCAN: 1335 ml urine noted

## 2020-10-28 NOTE — ED Notes (Signed)
Patient returned from CT

## 2020-10-28 NOTE — Consult Note (Signed)
PHARMACY -  BRIEF ANTIBIOTIC NOTE   Pharmacy has received consult(s) for vancomycin and cefepime from an ED provider.  The patient's profile has been reviewed for ht/wt/allergies/indication/available labs.    One time order(s) placed for cefepime 2 g and vancomycin 1500 mg IV  Further antibiotics/pharmacy consults should be ordered by admitting physician if indicated.                       Thank you, Darnelle Bos 10/28/2020  12:35 PM

## 2020-10-28 NOTE — ED Notes (Signed)
Dr Niu at bedside 

## 2020-10-28 NOTE — Consult Note (Signed)
CODE SEPSIS - PHARMACY COMMUNICATION  **Broad Spectrum Antibiotics should be administered within 1 hour of Sepsis diagnosis**  Time Code Sepsis Called/Page Received: 1223  Antibiotics Ordered: 1223  Time of 1st antibiotic administration: 1243  Additional action taken by pharmacy: N/A  If necessary, Name of Provider/Nurse Contacted: N/A    Darnelle Bos ,PharmD Clinical Pharmacist  10/28/2020  12:31 PM

## 2020-10-28 NOTE — ED Notes (Signed)
Patient transported to CT 

## 2020-10-28 NOTE — ED Notes (Signed)
Per MD foley cath to be placed

## 2020-10-28 NOTE — H&P (Signed)
History and Physical    Cole Mccarty QIH:[id number] DOB: April 03, 1941 DOA: 10/28/2020  Referring MD/NP/PA:   PCP: Cole Late, MD   Patient coming from:  The patient is coming from home.  At baseline, pt is independent for most of ADL.        Chief Complaint: fever, chills, fall, altered mental status, urinary retention  HPI: Cole SONES Sr. is a 80 y.o. 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  Per his wife, patient developed fever, chills, shaking since yesterday.  He has some dry cough, but no chest pain, shortness of breath.  No nausea, vomiting, diarrhea or abdominal pain. He has difficulty urinating and urinary retention.  Foley catheter is placed, 1.5 L of urine is removed in ED. No hematuria. Patient has history of dementia, but normally patient is alert, oriented x3.  He has been more confused since yesterday. Patient fell this morning, no loss of consciousness per his wife.  No significant injury.  No facial droop or slurred speech.  He has hx of poor dentition and dental caries.  He has left lower jaw pain and swelling.  Patient had history of dental procedure, but not recently.  ED Course: pt was found to have WBC 12.3, lactic acid 1.7, INR 1.1, negative urinalysis, negative COVID PCR, electrolytes renal function okay, temperature 100.6, blood pressure 155/96, 105/48, heart rate 104, RR 32, oxygen saturation 94% on room air.  Chest x-ray showed bilateral basilar atelectasis.  CT of maxillofacial image and CT of head negative for acute intracranial abnormalities, but showed dental caries and periodontal disease.  Patient is admitted to [place] bed as inpatient.  CT-renal stone study:  Horseshoe kidney. Bilateral nephrolithiasis. No visible ureteral  stones. Bilateral extrarenal pelves.  Multiple low-density lesions within the kidneys, the largest 3.5 cm in the right kidney  Left colonic diverticulosis.  No active diverticulitis.  Aortic atherosclerosis.  Review of Systems: Cold not be reviewed accurately due to altered mental status.   Allergy:  Allergies  Allergen Reactions  . Ezetimibe Other (See Comments)    unknown  . Niacin Other (See Comments)    Unknown  . Niacin-Lovastatin Er     Unknown  . Simvastatin     Unknown    Past Medical History:  Diagnosis Date  . Cancer (HCC)    lip cancer  . Complete heart block ([place])   . Diabetes mellitus without complication ([place])   . GERD (gastroesophageal reflux disease)   . Hard of hearing    severe  . Hyperlipidemia   . Hypertension   . PVD (peripheral vascular disease) ([place])     Past Surgical History:  Procedure Laterality Date  . COLONOSCOPY    . COLONOSCOPY WITH PROPOFOL N/A 02/12/2018   Procedure: COLONOSCOPY WITH PROPOFOL;  Surgeon: Lollie Sails, MD;  Location: [facility] ENDOSCOPY;  Service: Endoscopy;  Laterality: N/A;  . insert/replace/pacemaker    . PACEMAKER INSERTION N/A 08/07/2019   Procedure: PACEMAKER CHANGE OUT;  Surgeon: Isaias Cowman, MD;  Location: [facility] ORS;  Service: Cardiovascular;  Laterality: N/A;  . SURGERY OF LIP      Social History:  reports that he quit smoking about 31 years ago. His smoking use included cigarettes. He quit after 30.00 years of use. He has never used smokeless tobacco. He reports previous alcohol use.  He reports previous drug use.  Family History:  Family History  Problem Relation Age of Onset  . Stroke Sister   . Lung disease Sister        s/p of transplantation     Prior to Admission medications   Medication Sig Start Date End Date Taking? Authorizing Provider  amLODipine (NORVASC) 5 MG tablet Take 5 mg by mouth daily.   Yes [provider]  aspirin EC 81 MG tablet Take 81 mg by mouth daily.   Yes  [provider]  glipiZIDE (GLUCOTROL XL) 2.5 MG 24 hr tablet Take 1 tablet by mouth daily. 09/14/20  Yes [provider]  loratadine (CLARITIN) 10 MG tablet Take 10 mg by mouth daily.   Yes [provider]  losartan-hydrochlorothiazide (HYZAAR) 100-25 MG tablet Take 1 tablet by mouth daily.   Yes [provider]  memantine (NAMENDA) 10 MG tablet Take 10 mg by mouth 2 (two) times daily. 10/02/20  Yes [provider]  metFORMIN (GLUCOPHAGE) 1000 MG tablet Take 1,000 mg by mouth 2 (two) times daily.    Yes [provider]  Misc Natural Products (LUTEIN 20 PO) Take 1 tablet by mouth daily.    Yes [provider]  Multiple Vitamins-Minerals (CENTRUM PO) Take 1 tablet by mouth daily.   Yes [provider]  rosuvastatin (CRESTOR) 20 MG tablet Take 20 mg by mouth daily.   Yes [provider]  cephALEXin (KEFLEX) 250 MG capsule Take 2 capsules (500 mg total) by mouth 2 (two) times daily. Patient not taking: No sig reported 08/07/19   Isaias Cowman, MD    Physical Exam: Vitals:   10/28/20 1416 10/28/20 1500 10/28/20 1600 10/28/20 1643  BP:  (!) 105/55 (!) 120/58 127/64  Pulse:  92 81 78  Resp:  16 16 18   Temp: 99.6 F (37.6 C)   98.4 F (36.9 C)  TempSrc: Oral     SpO2:  94% 95% 99%  Weight:      Height:       General: Not in acute distress HEENT:       Eyes: PERRL, EOMI, no scleral icterus.       ENT: No discharge from the ears and nose, no pharynx injection, no tonsillar enlargement. Has poor dentition with multiple missing teeth. Has mild swelling and tenderness to the left lower jaw. Has dental caries in left law jaw. No skin erythema. No gingival abscess or fluctuance.  No sublingual swelling       Neck: No JVD, no bruit, no mass felt. Heme: No neck lymph node enlargement. Cardiac: S1/S2, RRR, No murmurs, No gallops or rubs. Respiratory: No rales, wheezing, rhonchi or rubs. GI: Soft, nondistended,  nontender, no organomegaly, BS present. GU: No hematuria Ext: No pitting leg edema bilaterally. 1+DP/PT pulse bilaterally. Musculoskeletal: No joint deformities, No joint redness or warmth, no limitation of ROM in spin. Skin: No rashes.  Neuro: Confused, oriented to person and place, not to time, cranial nerves II-XII grossly intact, moves all extremities Psych: Patient is not psychotic, no suicidal or hemocidal ideation.  Labs on Admission: I have personally reviewed following labs and imaging studies  CBC: Recent Labs  Lab 10/28/20 1200  WBC 12.3*  NEUTROABS 8.9*  HGB 15.8  HCT 47.3  MCV 89.2  PLT 683   Basic Metabolic Panel: Recent Labs  Lab 10/28/20 1200  NA 138  K 3.5  CL 100  CO2 25  GLUCOSE 113*  BUN 20  CREATININE  1.11  CALCIUM 9.7   GFR: Estimated Creatinine Clearance: 48.7 mL/min (by C-G formula based on SCr of 1.11 mg/dL). Liver Function Tests: Recent Labs  Lab 10/28/20 1200  AST 19  ALT 16  ALKPHOS 54  BILITOT 1.7*  PROT 8.1  ALBUMIN 4.7   No results for input(s): LIPASE, AMYLASE in the last 168 hours. No results for input(s): AMMONIA in the last 168 hours. Coagulation Profile: Recent Labs  Lab 10/28/20 1200  INR 1.1   Cardiac Enzymes: No results for input(s): CKTOTAL, CKMB, CKMBINDEX, TROPONINI in the last 168 hours. BNP (last 3 results) No results for input(s): PROBNP in the last 8760 hours. HbA1C: No results for input(s): HGBA1C in the last 72 hours. CBG: No results for input(s): GLUCAP in the last 168 hours. Lipid Profile: No results for input(s): CHOL, HDL, LDLCALC, TRIG, CHOLHDL, LDLDIRECT in the last 72 hours. Thyroid Function Tests: No results for input(s): TSH, T4TOTAL, FREET4, T3FREE, THYROIDAB in the last 72 hours. Anemia Panel: No results for input(s): VITAMINB12, FOLATE, FERRITIN, TIBC, IRON, RETICCTPCT in the last 72 hours. Urine analysis:    Component Value Date/Time   COLORURINE YELLOW (A) 10/28/2020 1349    APPEARANCEUR CLEAR (A) 10/28/2020 1349   LABSPEC 1.011 10/28/2020 1349   PHURINE 5.0 10/28/2020 1349   GLUCOSEU NEGATIVE 10/28/2020 1349   HGBUR MODERATE (A) 10/28/2020 1349   BILIRUBINUR NEGATIVE 10/28/2020 1349   KETONESUR NEGATIVE 10/28/2020 1349   PROTEINUR NEGATIVE 10/28/2020 1349   NITRITE NEGATIVE 10/28/2020 1349   LEUKOCYTESUR NEGATIVE 10/28/2020 1349   Sepsis Labs: @LABRCNTIP (procalcitonin:4,lacticidven:4) ) Recent Results (from the past 240 hour(s))  Resp Panel by RT-PCR (Flu A&B, Covid) Nasopharyngeal Swab     Status: None   Collection Time: 10/28/20 12:00 PM   Specimen: Nasopharyngeal Swab; Nasopharyngeal(NP) swabs in vial transport medium  Result Value Ref Range Status   SARS Coronavirus 2 by RT PCR NEGATIVE NEGATIVE Final    Comment: (NOTE) SARS-CoV-2 target nucleic acids are NOT DETECTED.  The SARS-CoV-2 RNA is generally detectable in upper respiratory specimens during the acute phase of infection. The lowest concentration of SARS-CoV-2 viral copies this assay can detect is 138 copies/mL. A negative result does not preclude SARS-Cov-2 infection and should not be used as the sole basis for treatment or other patient management decisions. A negative result may occur with  improper specimen collection/handling, submission of specimen other than nasopharyngeal swab, presence of viral mutation(s) within the areas targeted by this assay, and inadequate number of viral copies(<138 copies/mL). A negative result must be combined with clinical observations, patient history, and epidemiological information. The expected result is Negative.  Fact Sheet for Patients:  EntrepreneurPulse.com.au  Fact Sheet for Healthcare Providers:  IncredibleEmployment.be  This test is no t yet approved or cleared by the [place] FDA and  has been authorized for detection and/or diagnosis of SARS-CoV-2 by FDA under an Emergency Use Authorization  (EUA). This EUA will remain  in effect (meaning this test can be used) for the duration of the COVID-19 declaration under Section 564(b)(1) of the Act, 21 U.S.C.section 360bbb-3(b)(1), unless the authorization is terminated  or revoked sooner.       Influenza A by PCR NEGATIVE NEGATIVE Final   Influenza B by PCR NEGATIVE NEGATIVE Final    Comment: (NOTE) The Xpert Xpress SARS-CoV-2/FLU/RSV plus assay is intended as an aid in the diagnosis of influenza from Nasopharyngeal swab specimens and should not be used as a sole basis for treatment. Nasal washings and aspirates are  unacceptable for Xpert Xpress SARS-CoV-2/FLU/RSV testing.  Fact Sheet for Patients: EntrepreneurPulse.com.au  Fact Sheet for Healthcare Providers: IncredibleEmployment.be  This test is not yet approved or cleared by the [place] FDA and has been authorized for detection and/or diagnosis of SARS-CoV-2 by FDA under an Emergency Use Authorization (EUA). This EUA will remain in effect (meaning this test can be used) for the duration of the COVID-19 declaration under Section 564(b)(1) of the Act, 21 U.S.C. section 360bbb-3(b)(1), unless the authorization is terminated or revoked.  Performed at [facility], [address] [place], [place] [address]      Radiological Exams on Admission: CT Head Wo Contrast  Result Date: 10/28/2020 CLINICAL DATA:  Facial trauma, unwitnessed fall, swelling to chin EXAM: CT HEAD WITHOUT CONTRAST CT MAXILLOFACIAL WITHOUT CONTRAST TECHNIQUE: Multidetector CT imaging of the head and maxillofacial structures were performed using the standard protocol without intravenous contrast. Multiplanar CT image reconstructions of the maxillofacial structures were also generated. Right side of face marked with BB. COMPARISON:  CT head 12/02/2006 FINDINGS: CT HEAD FINDINGS Brain: Generalized atrophy. Normal ventricular morphology. No midline shift or  mass effect. Small vessel chronic ischemic changes of deep cerebral white matter. No intracranial hemorrhage, mass lesion, evidence of acute infarction, or extra-axial fluid collection. Vascular: No hyperdense vessels. Minimal atherosclerotic calcification of internal carotid arteries at skull base Skull: Intact Other: N/A CT MAXILLOFACIAL FINDINGS Osseous: TMJ alignment normal bilaterally. No facial bone fractures. Scattered dental caries and maxillary/mandibular periodontal disease. Degenerative disc and facet disease changes at visualized cervical spine. Orbits: Bony orbits intact. Intraorbital soft tissue planes clear without gas or fluid. Sinuses: Paranasal sinuses, mastoid air cells, and middle ear cavities clear bilaterally. Soft tissues: Soft tissue swelling overlying mandible especially on LEFT. Atherosclerotic calcifications at the carotid bifurcations bilaterally. IMPRESSION: Atrophy with small vessel chronic ischemic changes of deep cerebral white matter. No acute intracranial abnormalities. No acute facial bone abnormalities. Scattered dental caries and periodontal disease changes. Electronically Signed   By: Lavonia Dana M.D.   On: 10/28/2020 12:42   DG Chest Port 1 View  Addendum Date: 10/28/2020   ADDENDUM REPORT: 10/28/2020 13:17 ADDENDUM: Carotid vascular calcification consistent with carotid atherosclerotic vascular disease incidentally noted. Electronically Signed   By: Marcello Moores  Register   On: 10/28/2020 13:17   Result Date: 10/28/2020 CLINICAL DATA:  Questionable sepsis. EXAM: PORTABLE CHEST 1 VIEW COMPARISON:  Chest x-ray 05/12/2019. FINDINGS: Cardiac pacer stable position. Heart size upper limits normal. No pulmonary venous congestion. Low lung volumes with mild bibasilar atelectasis. No pleural effusion or pneumothorax. IMPRESSION: 1. Cardiac pacer stable position. Heart size upper limits normal. No pulmonary venous congestion. 2.  Low lung volumes with bibasilar atelectasis.  Electronically Signed: By: Marcello Moores  Register On: 10/28/2020 13:07   CT Renal Stone Study  Result Date: 10/28/2020 CLINICAL DATA:  Flank pain EXAM: CT ABDOMEN AND PELVIS WITHOUT CONTRAST TECHNIQUE: Multidetector CT imaging of the abdomen and pelvis was performed following the standard protocol without IV contrast. COMPARISON:  None. FINDINGS: Lower chest: Lung bases are clear. No effusions. Heart is normal size. Hepatobiliary: No focal hepatic abnormality. Gallbladder unremarkable. Pancreas: No focal abnormality or ductal dilatation. Spleen: No focal abnormality.  Normal size. Adrenals/Urinary Tract: Horseshoe kidney. Right renal stone measures 3 mm. Left lower pole renal stone measures 8 mm. Mild fullness of the renal pelves bilaterally, likely extrarenal pelves. No ureteral stones. Foley catheter in place with bladder decompressed. Multiple low-density lesions within the kidneys, the largest 3.5 cm in the right kidney.  Stomach/Bowel: Left colonic diverticulosis. No active diverticulitis. Normal appendix. Moderate stool burden throughout the colon. No bowel obstruction. Vascular/Lymphatic: Diffuse aortoiliac atherosclerosis. No evidence of aneurysm or adenopathy. Reproductive: No visible focal abnormality. Other: No free fluid or free air. Small left inguinal hernia containing fat and a loop of small bowel. No bowel obstruction. Musculoskeletal: No acute bony abnormality. IMPRESSION: Horseshoe kidney. Bilateral nephrolithiasis. No visible ureteral stones. Bilateral extrarenal pelves. Left colonic diverticulosis.  No active diverticulitis. Aortic atherosclerosis. Electronically Signed   By: Rolm Baptise M.D.   On: 10/28/2020 15:08   CT Maxillofacial Wo Contrast  Result Date: 10/28/2020 CLINICAL DATA:  Facial trauma, unwitnessed fall, swelling to chin EXAM: CT HEAD WITHOUT CONTRAST CT MAXILLOFACIAL WITHOUT CONTRAST TECHNIQUE: Multidetector CT imaging of the head and maxillofacial structures were performed using  the standard protocol without intravenous contrast. Multiplanar CT image reconstructions of the maxillofacial structures were also generated. Right side of face marked with BB. COMPARISON:  CT head 12/02/2006 FINDINGS: CT HEAD FINDINGS Brain: Generalized atrophy. Normal ventricular morphology. No midline shift or mass effect. Small vessel chronic ischemic changes of deep cerebral white matter. No intracranial hemorrhage, mass lesion, evidence of acute infarction, or extra-axial fluid collection. Vascular: No hyperdense vessels. Minimal atherosclerotic calcification of internal carotid arteries at skull base Skull: Intact Other: N/A CT MAXILLOFACIAL FINDINGS Osseous: TMJ alignment normal bilaterally. No facial bone fractures. Scattered dental caries and maxillary/mandibular periodontal disease. Degenerative disc and facet disease changes at visualized cervical spine. Orbits: Bony orbits intact. Intraorbital soft tissue planes clear without gas or fluid. Sinuses: Paranasal sinuses, mastoid air cells, and middle ear cavities clear bilaterally. Soft tissues: Soft tissue swelling overlying mandible especially on LEFT. Atherosclerotic calcifications at the carotid bifurcations bilaterally. IMPRESSION: Atrophy with small vessel chronic ischemic changes of deep cerebral white matter. No acute intracranial abnormalities. No acute facial bone abnormalities. Scattered dental caries and periodontal disease changes. Electronically Signed   By: Lavonia Dana M.D.   On: 10/28/2020 12:42     EKG: I have personally reviewed.  Seem to be paced rhythm, QTC 522  Assessment/Plan Principal Problem:   Sepsis ([place]) Active Problems:   Diabetes mellitus without complication ([place])   Hyperlipidemia   Hypertension   Dementia ([place])   Fall   Acute metabolic encephalopathy   Acute urinary retention   Periodontal disease   Horseshoe kidney   Sepsis: Patient meets criteria for sepsis with leukocytosis with WBC 12.3, tachycardia  with heart rate of 104, tachypnea with RR 32.  Lactic acid is normal.  Currently hemodynamically stable.  Source of infection is not clear.  Urinalysis negative.  Chest x-ray negative.  Potential differential diagnoses are dental caries and periodontal disease with infection.   -will admit to med-surg bed as inpt -Vancomycin, cefepime and Flagyl was started in ED, will continue -Follow-up blood culture and urine culture -will get Procalcitonin and trend lactic acid levels per sepsis protocol. -IVF: 2L of LR bolus in ED  Periodontal disease and dental caries: -Patient will need to follow-up with dentist  Diabetes mellitus without complication [facility]): Recent A1c 7.2, poorly controlled.  Patient taking metformin and glipizide -SSI  Hyperlipidemia -Crestor  Hypertension -Hold home amlodipine and Hyzaar since patient is at high risk of developing hypotension due to sepsis -IV hydralazine as needed  Dementia ([place]): No behavior change -Continue Namenda  Fall: No acute injury -PT/OT  Acute metabolic encephalopathy: Likely due to sepsis. -Frequent neuro check  Acute urinary retention -Foley catheter is placed with  Horseshoe kidney and  kidney living: -Patient will need urology referral at discharge        DVT ppx: SQ Lovenox Code Status: Full code Family Communication: Yes, patient's wife at bed side Disposition Plan:  Anticipate discharge back to previous environment Consults called:  none Admission status and Level of care: Med-Surg:   as inpt      Status is: Inpatient  Remains inpatient appropriate because:Inpatient level of care appropriate due to severity of illness   Dispo: The patient is from: Home              Anticipated d/c is to: Home              Patient currently is not medically stable to d/c.   Difficult to place patient No          Date of Service 10/28/2020    [facility] [place] [facility]   If 7PM-7AM, please contact  night-coverage [email or web address] 10/28/2020, 4:50 PM

## 2020-10-28 NOTE — ED Provider Notes (Signed)
[facility] Emergency Department Provider Note  ____________________________________________   Event Date/Time   First MD Initiated Contact with Patient 10/28/20 1142     (approximate)  I have reviewed the triage vital signs and the nursing notes.   HISTORY  Chief Complaint Fever, Altered Mental Status, and Fall    HPI Cole Mccarty. is a 80 y.o. male with history of dementia, hypertension, hyperlipidemia, here with chills and fatigue.  Patient has dementia and is hard of hearing so history primarily obtained via his wife.  Per report, the patient has been increasingly confused and had shaking chills for the last 24 hours.  He has been having what appears to be difficulty urinating, which is new as well.  He has had no cough.  No vomiting.  He has had slightly decreased appetite.  He has not complained of any pain to the wife.  No known sick contacts.  Per the patient's report, he denies any pain currently.  Specifically, he denies any headache or neck pain.  No vision changes.  Remainder of history is somewhat limited due to dementia and patient's hearing difficulties.        Past Medical History:  Diagnosis Date  . Cancer (HCC)    lip cancer  . Complete heart block ([place])   . Diabetes mellitus without complication ([place])   . GERD (gastroesophageal reflux disease)   . Hard of hearing    severe  . Hyperlipidemia   . Hypertension   . PVD (peripheral vascular disease) [facility])     Patient Active Problem List   Diagnosis Date Noted  . Sepsis ([place]) 10/28/2020  . Diabetes mellitus without complication ([place])   . GERD (gastroesophageal reflux disease)   . Hyperlipidemia   . Hypertension   . Dementia ([place])   . Fall   . Acute metabolic encephalopathy   . Acute urinary retention   . Periodontal disease     Past Surgical History:  Procedure Laterality Date  . COLONOSCOPY    . COLONOSCOPY WITH PROPOFOL N/A 02/12/2018   Procedure: COLONOSCOPY WITH  PROPOFOL;  Surgeon: Lollie Sails, MD;  Location: [facility] ENDOSCOPY;  Service: Endoscopy;  Laterality: N/A;  . insert/replace/pacemaker    . PACEMAKER INSERTION N/A 08/07/2019   Procedure: PACEMAKER CHANGE OUT;  Surgeon: Isaias Cowman, MD;  Location: [facility] ORS;  Service: Cardiovascular;  Laterality: N/A;  . SURGERY OF LIP      Prior to Admission medications   Medication Sig Start Date End Date Taking? Authorizing Provider  amLODipine (NORVASC) 5 MG tablet Take 5 mg by mouth daily.   Yes [provider]  aspirin EC 81 MG tablet Take 81 mg by mouth daily.   Yes [provider]  glipiZIDE (GLUCOTROL XL) 2.5 MG 24 hr tablet Take 1 tablet by mouth daily. 09/14/20  Yes [provider]  loratadine (CLARITIN) 10 MG tablet Take 10 mg by mouth daily.   Yes [provider]  losartan-hydrochlorothiazide (HYZAAR) 100-25 MG tablet Take 1 tablet by mouth daily.   Yes [provider]  memantine (NAMENDA) 10 MG tablet Take 10 mg by mouth 2 (two) times daily. 10/02/20  Yes [provider]  metFORMIN (GLUCOPHAGE) 1000 MG tablet Take 1,000 mg by mouth 2 (two) times daily.    Yes [provider]  Misc Natural Products (LUTEIN 20 PO) Take 1 tablet by mouth daily.    Yes [provider]  Multiple Vitamins-Minerals (CENTRUM PO) Take 1 tablet by mouth  daily.   Yes [provider]  rosuvastatin (CRESTOR) 20 MG tablet Take 20 mg by mouth daily.   Yes [provider]  cephALEXin (KEFLEX) 250 MG capsule Take 2 capsules (500 mg total) by mouth 2 (two) times daily. Patient not taking: No sig reported 08/07/19   Paraschos, Alexander, MD    Allergies Ezetimibe, Niacin, Niacin-lovastatin er, and Simvastatin  History reviewed. No pertinent family history.  Social History Social History   Tobacco Use  . Smoking status: Former Smoker    Years: 30.00    Types: Cigarettes    Quit date: 07/11/1989    Years since quitting: 31.3  .  Smokeless tobacco: Never Used  Substance Use Topics  . Alcohol use: Not Currently  . Drug use: Not Currently    Review of Systems  Review of Systems  Unable to perform ROS: Dementia  Constitutional: Positive for chills and fatigue.  Neurological: Positive for weakness.     ____________________________________________  PHYSICAL EXAM:      VITAL SIGNS: ED Triage Vitals  Enc Vitals Group     BP 10/28/20 1135 (!) 146/101     Pulse Rate 10/28/20 1135 95     Resp 10/28/20 1140 (!) 21     Temp 10/28/20 1140 (!) 100.6 F (38.1 C)     Temp Source 10/28/20 1140 Oral     SpO2 10/28/20 1135 96 %     Weight 10/28/20 1136 143 lb 4.8 oz (65 kg)     Height 10/28/20 1136 5\' 6"  (1.676 m)     Head Circumference --      Peak Flow --      Pain Score 10/28/20 1136 0     Pain Loc --      Pain Edu? --      Excl. in [place]? --      Physical Exam Vitals and nursing note reviewed.  Constitutional:      General: He is not in acute distress.    Appearance: He is well-developed.  HENT:     Head: Normocephalic and atraumatic.     Comments: Moderate swelling and tenderness to the left lower premolar space.  No overlying skin changes.  No gingival abscess or fluctuance.  No sublingual swelling. Eyes:     Conjunctiva/sclera: Conjunctivae normal.  Cardiovascular:     Rate and Rhythm: Normal rate and regular rhythm.     Heart sounds: Normal heart sounds.  Pulmonary:     Effort: Pulmonary effort is normal. No respiratory distress.     Breath sounds: No wheezing.  Abdominal:     General: There is no distension.     Tenderness: There is no abdominal tenderness.  Musculoskeletal:     Cervical back: Neck supple.  Skin:    General: Skin is warm.     Capillary Refill: Capillary refill takes less than 2 seconds.     Findings: No rash.  Neurological:     Mental Status: He is alert and oriented to person, place, and time.     Motor: No abnormal muscle tone.     Comments: Oriented to person and place  but not time, which is baseline.       ____________________________________________   LABS (all labs ordered are listed, but only abnormal results are displayed)  Labs Reviewed  COMPREHENSIVE METABOLIC PANEL - Abnormal; Notable for the following components:      Result Value   Glucose, Bld 113 (*)    Total Bilirubin 1.7 (*)  All other components within normal limits  URINALYSIS, COMPLETE (UACMP) WITH MICROSCOPIC - Abnormal; Notable for the following components:   Color, Urine YELLOW (*)    APPearance CLEAR (*)    Hgb urine dipstick MODERATE (*)    All other components within normal limits  CBC WITH DIFFERENTIAL/PLATELET - Abnormal; Notable for the following components:   WBC 12.3 (*)    Neutro Abs 8.9 (*)    Monocytes Absolute 2.1 (*)    All other components within normal limits  RESP PANEL BY RT-PCR (FLU A&B, COVID) ARPGX2  CULTURE, BLOOD (ROUTINE X 2)  CULTURE, BLOOD (ROUTINE X 2)  URINE CULTURE  LACTIC ACID, PLASMA  PROTIME-INR  PROCALCITONIN  CBG MONITORING, ED    ____________________________________________  EKG: Paced rhythm, ventricular rate 91.  QRS 166, QTc 522.  No acute ischemic changes compared to prior. ________________________________________  RADIOLOGY All imaging, including plain films, CT scans, and ultrasounds, independently reviewed by me, and interpretations confirmed via formal radiology reads.  ED MD interpretation:   CT renal stone: Horseshoe kidney, no acute abnormality Chest x-ray: Carotid vascular calcification, pacer leads in place, bibasilar atelectasis CT max/face: Periodontal disease, no acute fracture or abnormality CT head: No acute intracranial abnormality  Official radiology report(s): CT Head Wo Contrast  Result Date: 10/28/2020 CLINICAL DATA:  Facial trauma, unwitnessed fall, swelling to chin EXAM: CT HEAD WITHOUT CONTRAST CT MAXILLOFACIAL WITHOUT CONTRAST TECHNIQUE: Multidetector CT imaging of the head and maxillofacial  structures were performed using the standard protocol without intravenous contrast. Multiplanar CT image reconstructions of the maxillofacial structures were also generated. Right side of face marked with BB. COMPARISON:  CT head 12/02/2006 FINDINGS: CT HEAD FINDINGS Brain: Generalized atrophy. Normal ventricular morphology. No midline shift or mass effect. Small vessel chronic ischemic changes of deep cerebral white matter. No intracranial hemorrhage, mass lesion, evidence of acute infarction, or extra-axial fluid collection. Vascular: No hyperdense vessels. Minimal atherosclerotic calcification of internal carotid arteries at skull base Skull: Intact Other: N/A CT MAXILLOFACIAL FINDINGS Osseous: TMJ alignment normal bilaterally. No facial bone fractures. Scattered dental caries and maxillary/mandibular periodontal disease. Degenerative disc and facet disease changes at visualized cervical spine. Orbits: Bony orbits intact. Intraorbital soft tissue planes clear without gas or fluid. Sinuses: Paranasal sinuses, mastoid air cells, and middle ear cavities clear bilaterally. Soft tissues: Soft tissue swelling overlying mandible especially on LEFT. Atherosclerotic calcifications at the carotid bifurcations bilaterally. IMPRESSION: Atrophy with small vessel chronic ischemic changes of deep cerebral white matter. No acute intracranial abnormalities. No acute facial bone abnormalities. Scattered dental caries and periodontal disease changes. Electronically Signed   By: Lavonia Dana M.D.   On: 10/28/2020 12:42   DG Chest Port 1 View  Addendum Date: 10/28/2020   ADDENDUM REPORT: 10/28/2020 13:17 ADDENDUM: Carotid vascular calcification consistent with carotid atherosclerotic vascular disease incidentally noted. Electronically Signed   By: Marcello Moores  Register   On: 10/28/2020 13:17   Result Date: 10/28/2020 CLINICAL DATA:  Questionable sepsis. EXAM: PORTABLE CHEST 1 VIEW COMPARISON:  Chest x-ray 05/12/2019. FINDINGS:  Cardiac pacer stable position. Heart size upper limits normal. No pulmonary venous congestion. Low lung volumes with mild bibasilar atelectasis. No pleural effusion or pneumothorax. IMPRESSION: 1. Cardiac pacer stable position. Heart size upper limits normal. No pulmonary venous congestion. 2.  Low lung volumes with bibasilar atelectasis. Electronically Signed: ByMarcello Moores  Register On: 10/28/2020 13:07   CT Renal Stone Study  Result Date: 10/28/2020 CLINICAL DATA:  Flank pain EXAM: CT ABDOMEN AND PELVIS WITHOUT CONTRAST TECHNIQUE: Multidetector  CT imaging of the abdomen and pelvis was performed following the standard protocol without IV contrast. COMPARISON:  None. FINDINGS: Lower chest: Lung bases are clear. No effusions. Heart is normal size. Hepatobiliary: No focal hepatic abnormality. Gallbladder unremarkable. Pancreas: No focal abnormality or ductal dilatation. Spleen: No focal abnormality.  Normal size. Adrenals/Urinary Tract: Horseshoe kidney. Right renal stone measures 3 mm. Left lower pole renal stone measures 8 mm. Mild fullness of the renal pelves bilaterally, likely extrarenal pelves. No ureteral stones. Foley catheter in place with bladder decompressed. Multiple low-density lesions within the kidneys, the largest 3.5 cm in the right kidney. Stomach/Bowel: Left colonic diverticulosis. No active diverticulitis. Normal appendix. Moderate stool burden throughout the colon. No bowel obstruction. Vascular/Lymphatic: Diffuse aortoiliac atherosclerosis. No evidence of aneurysm or adenopathy. Reproductive: No visible focal abnormality. Other: No free fluid or free air. Small left inguinal hernia containing fat and a loop of small bowel. No bowel obstruction. Musculoskeletal: No acute bony abnormality. IMPRESSION: Horseshoe kidney. Bilateral nephrolithiasis. No visible ureteral stones. Bilateral extrarenal pelves. Left colonic diverticulosis.  No active diverticulitis. Aortic atherosclerosis. Electronically  Signed   By: Rolm Baptise M.D.   On: 10/28/2020 15:08   CT Maxillofacial Wo Contrast  Result Date: 10/28/2020 CLINICAL DATA:  Facial trauma, unwitnessed fall, swelling to chin EXAM: CT HEAD WITHOUT CONTRAST CT MAXILLOFACIAL WITHOUT CONTRAST TECHNIQUE: Multidetector CT imaging of the head and maxillofacial structures were performed using the standard protocol without intravenous contrast. Multiplanar CT image reconstructions of the maxillofacial structures were also generated. Right side of face marked with BB. COMPARISON:  CT head 12/02/2006 FINDINGS: CT HEAD FINDINGS Brain: Generalized atrophy. Normal ventricular morphology. No midline shift or mass effect. Small vessel chronic ischemic changes of deep cerebral white matter. No intracranial hemorrhage, mass lesion, evidence of acute infarction, or extra-axial fluid collection. Vascular: No hyperdense vessels. Minimal atherosclerotic calcification of internal carotid arteries at skull base Skull: Intact Other: N/A CT MAXILLOFACIAL FINDINGS Osseous: TMJ alignment normal bilaterally. No facial bone fractures. Scattered dental caries and maxillary/mandibular periodontal disease. Degenerative disc and facet disease changes at visualized cervical spine. Orbits: Bony orbits intact. Intraorbital soft tissue planes clear without gas or fluid. Sinuses: Paranasal sinuses, mastoid air cells, and middle ear cavities clear bilaterally. Soft tissues: Soft tissue swelling overlying mandible especially on LEFT. Atherosclerotic calcifications at the carotid bifurcations bilaterally. IMPRESSION: Atrophy with small vessel chronic ischemic changes of deep cerebral white matter. No acute intracranial abnormalities. No acute facial bone abnormalities. Scattered dental caries and periodontal disease changes. Electronically Signed   By: Lavonia Dana M.D.   On: 10/28/2020 12:42    ____________________________________________  PROCEDURES   Procedure(s) performed (including  Critical Care):  .Critical Care Performed by: Duffy Bruce, MD Authorized by: Duffy Bruce, MD   Critical care provider statement:    Critical care time (minutes):  35   Critical care time was exclusive of:  Separately billable procedures and treating other patients and teaching time   Critical care was necessary to treat or prevent imminent or life-threatening deterioration of the following conditions:  Cardiac failure, circulatory failure and sepsis   Critical care was time spent personally by me on the following activities:  Development of treatment plan with patient or surrogate, discussions with consultants, evaluation of patient's response to treatment, examination of patient, obtaining history from patient or surrogate, ordering and performing treatments and interventions, ordering and review of laboratory studies, ordering and review of radiographic studies, pulse oximetry, re-evaluation of patient's condition and review of old charts  I assumed direction of critical care for this patient from another provider in my specialty: no      ____________________________________________  INITIAL IMPRESSION / MDM / [place] / ED COURSE  As part of my medical decision making, I reviewed the following data within the [place] notes reviewed and incorporated, Old chart reviewed, Notes from prior ED visits, and [place] [place] was evaluated in Emergency Department on 10/28/2020 for the symptoms described in the history of present illness. He was evaluated in the context of the global COVID-19 pandemic, which necessitated consideration that the patient might be at risk for infection with the SARS-CoV-2 virus that causes COVID-19. Institutional protocols and algorithms that pertain to the evaluation of patients at risk for COVID-19 are in a state of rapid change based on information released by regulatory bodies  including the CDC and federal and state organizations. These policies and algorithms were followed during the patient's care in the ED.  Some ED evaluations and interventions may be delayed as a result of limited staffing during the pandemic.*     Medical Decision Making: 80 year old male here with chills and fever with confusion.  Suspect sepsis, possibly related to dental source given tenderness and swelling on exam with significant periodontal disease.  CT of the face and sinuses reviewed, shows no evidence of abscess or bony infection.  CT head unremarkable.  Otherwise, lab work shows moderate leukocytosis but is otherwise reassuring.  Lactic acid is normal.  CMP unremarkable with normal renal function.  No evidence of severe sepsis.  Urinalysis obtained, shows no significant UTI.  Of note, patient did have urinary retention and I suspect he has some underlying BPH.  This does not appear to be infected, however, so unclear chronicity.  Foley catheter placed with drainage of 1.5 L of fluid.  Will give fluid replacement.  Plan to admit to medicine.  Broad-spectrum antibiotics initiated, will be cautious with fluids in the absence of signs of severe sepsis.  ____________________________________________  FINAL CLINICAL IMPRESSION(S) / ED DIAGNOSES  Final diagnoses:  Sepsis without acute organ dysfunction, due to unspecified organism ([place])  Weakness     MEDICATIONS GIVEN DURING THIS VISIT:  Medications  vancomycin (VANCOREADY) IVPB 1500 mg/300 mL (1,500 mg Intravenous New Bag/Given 10/28/20 1350)  acetaminophen (TYLENOL) tablet 650 mg (has no administration in time range)  acetaminophen (TYLENOL) suppository 650 mg (has no administration in time range)  metroNIDAZOLE (FLAGYL) IVPB 500 mg (has no administration in time range)  hydrALAZINE (APRESOLINE) injection 5 mg (has no administration in time range)  hydrOXYzine (VISTARIL) injection 25 mg (has no administration in time range)  insulin  aspart (novoLOG) injection 0-9 Units (has no administration in time range)  insulin aspart (novoLOG) injection 0-5 Units (has no administration in time range)  lactated ringers bolus 1,000 mL (has no administration in time range)  enoxaparin (LOVENOX) injection 40 mg (has no administration in time range)  lactated ringers bolus 1,000 mL (0 mLs Intravenous Stopped 10/28/20 1345)  ceFEPIme (MAXIPIME) 2 g in sodium chloride 0.9 % 100 mL IVPB (0 g Intravenous Stopped 10/28/20 1314)  metroNIDAZOLE (FLAGYL) IVPB 500 mg (0 mg Intravenous Stopped 10/28/20 1350)  acetaminophen (TYLENOL) tablet 1,000 mg (1,000 mg Oral Given 10/28/20 1413)     ED Discharge Orders    None       Note:  This document was prepared using Dragon voice recognition software and may  include unintentional dictation errors.   Duffy Bruce, MD 10/28/20 [phone]

## 2020-10-28 NOTE — ED Notes (Signed)
MD at bedside. 

## 2020-10-29 DIAGNOSIS — F0280 Dementia in other diseases classified elsewhere without behavioral disturbance: Secondary | ICD-10-CM

## 2020-10-29 DIAGNOSIS — K029 Dental caries, unspecified: Secondary | ICD-10-CM

## 2020-10-29 DIAGNOSIS — G9341 Metabolic encephalopathy: Secondary | ICD-10-CM

## 2020-10-29 DIAGNOSIS — A419 Sepsis, unspecified organism: Principal | ICD-10-CM

## 2020-10-29 DIAGNOSIS — R338 Other retention of urine: Secondary | ICD-10-CM

## 2020-10-29 DIAGNOSIS — G309 Alzheimer's disease, unspecified: Secondary | ICD-10-CM

## 2020-10-29 LAB — BASIC METABOLIC PANEL
Anion gap: 9 (ref 5–15)
BUN: 18 mg/dL (ref 8–23)
CO2: 25 mmol/L (ref 22–32)
Calcium: 8.9 mg/dL (ref 8.9–10.3)
Chloride: 107 mmol/L (ref 98–111)
Creatinine, Ser: 1.1 mg/dL (ref 0.61–1.24)
GFR, Estimated: >60 mL/min (ref >60)
Glucose, Bld: 112 mg/dL — ABNORMAL HIGH (ref 70–99)
Potassium: 3.5 mmol/L (ref 3.5–5.1)
Sodium: 141 mmol/L (ref 135–145)

## 2020-10-29 LAB — CBC
HCT: 39.6 % (ref 39.0–52.0)
Hemoglobin: 13.1 g/dL (ref 13.0–17.0)
MCH: 30 pg (ref 26.0–34.0)
MCHC: 33.1 g/dL (ref 30.0–36.0)
MCV: 90.6 fL (ref 80.0–100.0)
Platelets: 162 10*3/uL (ref 150–400)
RBC: 4.37 MIL/uL (ref 4.22–5.81)
RDW: 14.1 % (ref 11.5–15.5)
WBC: 11 10*3/uL — ABNORMAL HIGH (ref 4.0–10.5)
nRBC: 0 % (ref 0.0–0.2)

## 2020-10-29 LAB — GLUCOSE, CAPILLARY
Glucose-Capillary: 127 mg/dL — ABNORMAL HIGH (ref 70–99)
Glucose-Capillary: 107 mg/dL — ABNORMAL HIGH (ref 70–99)
Glucose-Capillary: 186 mg/dL — ABNORMAL HIGH (ref 70–99)
Glucose-Capillary: 133 mg/dL — ABNORMAL HIGH (ref 70–99)
Glucose-Capillary: 138 mg/dL — ABNORMAL HIGH (ref 70–99)

## 2020-10-29 MED ORDER — CHLORHEXIDINE GLUCONATE CLOTH 2 % EX PADS
6.0000 | MEDICATED_PAD | Freq: Every day | CUTANEOUS | Status: DC
  Administered 2020-10-29 – 2020-10-30 (×2): 6 via TOPICAL

## 2020-10-29 MED ORDER — TAMSULOSIN HCL 0.4 MG PO CAPS
0.4000 mg | ORAL_CAPSULE | Freq: Every day | ORAL | Status: DC
  Administered 2020-10-29 – 2020-10-30 (×2): 0.4 mg via ORAL
  Filled 2020-10-29 (×2): qty 1

## 2020-10-29 MED ORDER — SODIUM CHLORIDE 0.9 % IV SOLN
3.0000 g | Freq: Three times a day (TID) | INTRAVENOUS | Status: DC
  Administered 2020-10-29 – 2020-10-30 (×3): 3 g via INTRAVENOUS
  Filled 2020-10-29: qty 3
  Filled 2020-10-29 (×2): qty 8
  Filled 2020-10-29 (×2): qty 3

## 2020-10-29 NOTE — Evaluation (Signed)
Occupational Therapy Evaluation Patient Details Name: Cole BHARGAVA Sr. MRN: [id number] DOB: 1940-11-20 Today's Date: 10/29/2020    History of Present Illness 80 y.o. 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 Pt presents with fever, chills, fall, altered mental status, urinary retention.   Clinical Impression   Pt seen for OT evaluation on this date. Upon arrival to room, pt finishing up bathing with NT and agreeable to OT eval/tx. Pt is very HOH and PLOF confirmed from wife at bedside. Prior to admission, pt was living with his wife in a one story home. Pt was independent with ADLs and functional mobility (without AD), however wife reports pt has a shuffled gait at baseline and endorses 3-4 falls in past 6 months, with most recent fall occurring as pt was performing LB dressing. Pt currently presents with decreased strength, balance, and activity tolerance, and requires SUPERVISION-MIN GUARD for UB/LB dressing and functional mobility of household distances (~123ft) with RW. Of note, one forward LOB noted during seated LB dressing at EOB, requiring CGA to steady. Pt would benefit from additional skilled OT services to maximize return to PLOF and minimize risk of future falls, injury, caregiver burden, and readmission. Upon discharge, recommend HHOT (although pt's wife declining HHOT recommendation at time of eval).    Follow Up Recommendations  Home health OT;Supervision - Intermittent    Equipment Recommendations  Other (comment) (2ww)       Precautions / Restrictions Precautions Precautions: Fall Restrictions Weight Bearing Restrictions: No      Mobility Bed Mobility Overal bed mobility: Needs Assistance Bed Mobility: Supine to Sit     Supine to sit: Supervision          Transfers Overall transfer level: Needs assistance Equipment  used: None Transfers: Sit to/from Stand Sit to Stand: Min guard              Balance Overall balance assessment: Needs assistance Sitting-balance support: No upper extremity supported;Feet unsupported Sitting balance-Leahy Scale: Poor Sitting balance - Comments: Fair static sitting balance at EOB. One forward LOB noted during LB dressing, requiring CGA to steady   Standing balance support: Bilateral upper extremity supported;During functional activity Standing balance-Leahy Scale: Fair Standing balance comment: With use of RW, able to walk 131ft with supervision only                           ADL either performed or assessed with clinical judgement   ADL Overall ADL's : Needs assistance/impaired                 Upper Body Dressing : Set up;Supervision/safety;Bed level Upper Body Dressing Details (indicate cue type and reason): To don hospital gown Lower Body Dressing: Min guard;Sitting/lateral leans Lower Body Dressing Details (indicate cue type and reason): To don socks while sitting EOB. One forward LOB observed, requiring CGA to regain balance             Functional mobility during ADLs: Supervision/safety;Rolling walker (walking 167ft with RW)       Vision Baseline Vision/History: Wears glasses Wears Glasses: At all times              Pertinent Vitals/Pain Pain Assessment: No/denies pain        Extremity/Trunk Assessment Upper Extremity Assessment Upper Extremity Assessment: Generalized weakness   Lower Extremity Assessment Lower Extremity Assessment: Generalized weakness  Communication Communication Communication: HOH   Cognition Arousal/Alertness: Awake/alert Behavior During Therapy: WFL for tasks assessed/performed Overall Cognitive Status: History of cognitive impairments - at baseline                                 General Comments: Pt is very HOH. Pleasant and agreeable throughout      Exercises  Other Exercises Other Exercises: Educated pt and family on role of OT, POC, and discharge recommendation   Shoulder Instructions      Home Living Family/patient expects to be discharged to:: Private residence Living Arrangements: Spouse/significant other Available Help at Discharge: Family;Available 24 hours/day;Available PRN/intermittently (Son and daughter available PRN) Type of Home: House Home Access: Stairs to enter [employer] of Steps: 3-4 Entrance Stairs-Rails: Left Home Layout: One level     Bathroom Shower/Tub: Tub/shower unit;Walk-in shower         Home Equipment: Grab bars - tub/shower          Prior Functioning/Environment Level of Independence: Independent        Comments: Per wife, pt is independent with functional mobility (no AD) and independent with ADLs. Pt rides lawn mower, but wife performs all other IADLs. Wife endorses 3-4 falls in past 6 months, but no major injuries        OT Problem List: Decreased strength;Decreased activity tolerance;Impaired balance (sitting and/or standing)      OT Treatment/Interventions: Self-care/ADL training;Therapeutic exercise;Energy conservation;DME and/or AE instruction;Therapeutic activities;Patient/family education;Balance training    OT Goals(Current goals can be found in the care plan section) Acute Rehab OT Goals Patient Stated Goal: to go home OT Goal Formulation: With patient/family Time For Goal Achievement: 11/12/20 Potential to Achieve Goals: Good ADL Goals Pt Will Perform Grooming: with modified independence;standing Pt Will Perform Lower Body Dressing: with modified independence;sit to/from stand Pt Will Perform Toileting - Clothing Manipulation and hygiene: with modified independence;sit to/from stand  OT Frequency: Min 1X/week    AM-PAC OT "6 Clicks" Daily Activity     Outcome Measure Help from another person eating meals?: None Help from another person taking care of personal  grooming?: None Help from another person toileting, which includes using toliet, bedpan, or urinal?: A Little Help from another person bathing (including washing, rinsing, drying)?: A Little Help from another person to put on and taking off regular upper body clothing?: A Little Help from another person to put on and taking off regular lower body clothing?: A Little 6 Click Score: 20   End of Session Equipment Utilized During Treatment: Gait belt;Rolling walker Nurse Communication: Mobility status  Activity Tolerance: Patient tolerated treatment well Patient left: in chair;with call bell/phone within reach;with family/visitor present  OT Visit Diagnosis: Unsteadiness on feet (R26.81);Muscle weakness (generalized) (M62.81)                Time: 8921-1941 OT Time Calculation (min): 24 min Charges:  OT General Charges $OT Visit: 1 Visit OT Evaluation $OT Eval Moderate Complexity: 1 Mod OT Treatments $Therapeutic Activity: 8-22 mins  Fredirick Maudlin, OTR/L [place]

## 2020-10-29 NOTE — Evaluation (Signed)
Physical Therapy Evaluation Patient Details Name: Cole WICHMANN Sr. MRN: [id number] DOB: 10-Jul-1941 Today's Date: 10/29/2020   History of Present Illness  80 y.o. 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 Sepsis, now resolved.    Clinical Impression  Patient agreeable to PT, spouse at the bedside. Patient was able to stand and ambulate in hallway without rolling walker with intermittent shuffled gait pattern that is more pronounced with short distance around obstacles and with turns. Occasional cues required for safety as patient with fast cadence with ambulation. Patient is likely nearing his baseline level of functional mobility. Recommend PT follow up to maximize independence and safety as patient has had some falls recently at home. HHPT recommended at discharge with supervision from spouse a needed.     Follow Up Recommendations Home health PT;Supervision - Intermittent    Equipment Recommendations  None recommended by PT    Recommendations for Other Services       Precautions / Restrictions Precautions Precautions: Fall Restrictions Weight Bearing Restrictions: No      Mobility  Bed Mobility Overal bed mobility: Needs Assistance Bed Mobility: Sit to Supine       Sit to supine: Modified independent (Device/Increase time)        Transfers Overall transfer level: Needs assistance Equipment used: None Transfers: Sit to/from Stand Sit to Stand: Supervision         General transfer comment: supervision for safety  Ambulation/Gait Ambulation/Gait assistance: Min guard;Supervision Gait Distance (Feet): 100 Feet Assistive device: None Gait Pattern/deviations: Shuffle;Decreased stride length Gait velocity: not formally measured but faster than  average   General Gait Details: patient ambulated in hallway without loss of balance. patient has more pronounced shuffled gait when navigating shorter distance, around obstacles, and turning. occasional cues for safety as patient with fast cadence and mildly impulsive with activity  Stairs            Wheelchair Mobility    Modified Rankin (Stroke Patients Only)       Balance Overall balance assessment: Needs assistance Sitting-balance support: No upper extremity supported;Feet unsupported Sitting balance-Leahy Scale: Fair     Standing balance support: No upper extremity supported Standing balance-Leahy Scale: Fair                               Pertinent Vitals/Pain Pain Assessment: No/denies pain    Home Living Family/patient expects to be discharged to:: Private residence Living Arrangements: Spouse/significant other Available Help at Discharge: Family;Available 24 hours/day;Available PRN/intermittently Type of Home: House Home Access: Stairs to enter Entrance Stairs-Rails: Left Entrance Stairs-Number of Steps: 3-4 Home Layout: One level Home Equipment: Grab bars - tub/shower      Prior Function Level of Independence: Independent         Comments: spouse reports patient was independent with ambulation and can walk around the block at home without supervision. Spouse does report falls recently (possibly from seated position where he leans too far forward)     Hand Dominance        Extremity/Trunk Assessment   Upper Extremity Assessment Upper Extremity Assessment: Generalized weakness    Lower Extremity Assessment Lower Extremity Assessment: Generalized weakness       Communication   Communication: HOH  Cognition Arousal/Alertness: Awake/alert Behavior During Therapy: WFL for  tasks assessed/performed Overall Cognitive Status: History of cognitive impairments - at baseline                                 General  Comments: patient is cooperative during session. patient able to follow single step commands without difficulty. mildly impulsive      General Comments      Exercises     Assessment/Plan    PT Assessment Patient needs continued PT services  PT Problem List Decreased strength;Decreased activity tolerance;Decreased balance;Decreased mobility;Decreased knowledge of use of DME;Decreased safety awareness       PT Treatment Interventions DME instruction;Gait training;Stair training;Functional mobility training;Therapeutic activities;Balance training;Therapeutic exercise;Neuromuscular re-education    PT Goals (Current goals can be found in the Care Plan section)  Acute Rehab PT Goals Patient Stated Goal: to go home PT Goal Formulation: With patient Time For Goal Achievement: 11/12/20 Potential to Achieve Goals: Good    Frequency Min 2X/week   Barriers to discharge        Co-evaluation               AM-PAC PT "6 Clicks" Mobility  Outcome Measure Help needed turning from your back to your side while in a flat bed without using bedrails?: None Help needed moving from lying on your back to sitting on the side of a flat bed without using bedrails?: A Little Help needed moving to and from a bed to a chair (including a wheelchair)?: A Little Help needed standing up from a chair using your arms (e.g., wheelchair or bedside chair)?: A Little Help needed to walk in hospital room?: A Little Help needed climbing 3-5 steps with a railing? : A Little 6 Click Score: 19    End of Session Equipment Utilized During Treatment: Gait belt Activity Tolerance: Patient tolerated treatment well Patient left: in bed;with call bell/phone within reach;with bed alarm set;with family/visitor present Nurse Communication: Mobility status PT Visit Diagnosis: Unsteadiness on feet (R26.81);Muscle weakness (generalized) (M62.81);History of falling (Z91.81)    Time: 1021-1173 PT Time Calculation (min)  (ACUTE ONLY): 22 min   Charges:   PT Evaluation $PT Eval Moderate Complexity: 1 Mod PT Treatments $Therapeutic Activity: 8-22 mins        Minna Merritts, PT, MPT  Percell Locus 10/29/2020, 2:17 PM

## 2020-10-29 NOTE — Plan of Care (Signed)

## 2020-10-29 NOTE — Progress Notes (Addendum)
[facility] [place] NAME: Cole Mccarty    MR#:  [id number]  DATE OF BIRTH:  1941-07-11  SUBJECTIVE:   Patient is hard on hearing. He has baseline confusion due to chronic dementia. Wife in the room. Ate good breakfast and lunch. No fever. Worked with physical therapy according to wife. No other issues per RN. REVIEW OF SYSTEMS:   Review of Systems  Unable to perform ROS: Dementia   Tolerating Diet:yes Tolerating PT: HHPt  DRUG ALLERGIES:   Allergies  Allergen Reactions  . Ezetimibe Other (See Comments)    unknown  . Niacin Other (See Comments)    Unknown  . Niacin-Lovastatin Er     Unknown  . Simvastatin     Unknown    VITALS:  Blood pressure (!) 118/57, pulse 80, temperature 98.8 F (37.1 C), resp. rate 15, height 5\' 6"  (1.676 m), weight 65 kg, SpO2 97 %.  PHYSICAL EXAMINATION:   Physical Exam  GENERAL:  80 y.o.-year-old patient lying in the bed with no acute distress.  LUNGS: Normal breath sounds bilaterally, no wheezing, rales, rhonchi. No use of accessory muscles of respiration.  CARDIOVASCULAR: S1, S2 normal. No murmurs, rubs, or gallops.  ABDOMEN: Soft, nontender, nondistended. Bowel sounds present. No organomegaly or mass.  EXTREMITIES: No cyanosis, clubbing or edema b/l.    NEUROLOGIC: Cranial nerves II through XII are intact. No focal Motor or sensory deficits b/l.   PSYCHIATRIC:  patient is alert and oriented x 3.  SKIN: No obvious rash, lesion, or ulcer.   LABORATORY PANEL:  CBC Recent Labs  Lab 10/29/20 0437  WBC 11.0*  HGB 13.1  HCT 39.6  PLT 162    Chemistries  Recent Labs  Lab 10/28/20 1200 10/29/20 0437  NA 138 141  K 3.5 3.5  CL 100 107  CO2 25 25  GLUCOSE 113* 112*  BUN 20 18  CREATININE 1.11 1.10  CALCIUM 9.7 8.9  AST 19  --   ALT 16  --   ALKPHOS 54  --   BILITOT 1.7*  --    Cardiac Enzymes No results for input(s): TROPONINI in the last 168 hours. RADIOLOGY:  CT Head Wo  Contrast  Result Date: 10/28/2020 CLINICAL DATA:  Facial trauma, unwitnessed fall, swelling to chin EXAM: CT HEAD WITHOUT CONTRAST CT MAXILLOFACIAL WITHOUT CONTRAST TECHNIQUE: Multidetector CT imaging of the head and maxillofacial structures were performed using the standard protocol without intravenous contrast. Multiplanar CT image reconstructions of the maxillofacial structures were also generated. Right side of face marked with BB. COMPARISON:  CT head 12/02/2006 FINDINGS: CT HEAD FINDINGS Brain: Generalized atrophy. Normal ventricular morphology. No midline shift or mass effect. Small vessel chronic ischemic changes of deep cerebral white matter. No intracranial hemorrhage, mass lesion, evidence of acute infarction, or extra-axial fluid collection. Vascular: No hyperdense vessels. Minimal atherosclerotic calcification of internal carotid arteries at skull base Skull: Intact Other: N/A CT MAXILLOFACIAL FINDINGS Osseous: TMJ alignment normal bilaterally. No facial bone fractures. Scattered dental caries and maxillary/mandibular periodontal disease. Degenerative disc and facet disease changes at visualized cervical spine. Orbits: Bony orbits intact. Intraorbital soft tissue planes clear without gas or fluid. Sinuses: Paranasal sinuses, mastoid air cells, and middle ear cavities clear bilaterally. Soft tissues: Soft tissue swelling overlying mandible especially on LEFT. Atherosclerotic calcifications at the carotid bifurcations bilaterally. IMPRESSION: Atrophy with small vessel chronic ischemic changes of deep cerebral white matter. No acute intracranial abnormalities. No acute facial bone abnormalities. Scattered dental  caries and periodontal disease changes. Electronically Signed   By: Lavonia Dana M.D.   On: 10/28/2020 12:42   DG Chest Port 1 View  Addendum Date: 10/28/2020   ADDENDUM REPORT: 10/28/2020 13:17 ADDENDUM: Carotid vascular calcification consistent with carotid atherosclerotic vascular disease  incidentally noted. Electronically Signed   By: Marcello Moores  Register   On: 10/28/2020 13:17   Result Date: 10/28/2020 CLINICAL DATA:  Questionable sepsis. EXAM: PORTABLE CHEST 1 VIEW COMPARISON:  Chest x-ray 05/12/2019. FINDINGS: Cardiac pacer stable position. Heart size upper limits normal. No pulmonary venous congestion. Low lung volumes with mild bibasilar atelectasis. No pleural effusion or pneumothorax. IMPRESSION: 1. Cardiac pacer stable position. Heart size upper limits normal. No pulmonary venous congestion. 2.  Low lung volumes with bibasilar atelectasis. Electronically Signed: By: Marcello Moores  Register On: 10/28/2020 13:07   CT Renal Stone Study  Result Date: 10/28/2020 CLINICAL DATA:  Flank pain EXAM: CT ABDOMEN AND PELVIS WITHOUT CONTRAST TECHNIQUE: Multidetector CT imaging of the abdomen and pelvis was performed following the standard protocol without IV contrast. COMPARISON:  None. FINDINGS: Lower chest: Lung bases are clear. No effusions. Heart is normal size. Hepatobiliary: No focal hepatic abnormality. Gallbladder unremarkable. Pancreas: No focal abnormality or ductal dilatation. Spleen: No focal abnormality.  Normal size. Adrenals/Urinary Tract: Horseshoe kidney. Right renal stone measures 3 mm. Left lower pole renal stone measures 8 mm. Mild fullness of the renal pelves bilaterally, likely extrarenal pelves. No ureteral stones. Foley catheter in place with bladder decompressed. Multiple low-density lesions within the kidneys, the largest 3.5 cm in the right kidney. Stomach/Bowel: Left colonic diverticulosis. No active diverticulitis. Normal appendix. Moderate stool burden throughout the colon. No bowel obstruction. Vascular/Lymphatic: Diffuse aortoiliac atherosclerosis. No evidence of aneurysm or adenopathy. Reproductive: No visible focal abnormality. Other: No free fluid or free air. Small left inguinal hernia containing fat and a loop of small bowel. No bowel obstruction. Musculoskeletal: No acute  bony abnormality. IMPRESSION: Horseshoe kidney. Bilateral nephrolithiasis. No visible ureteral stones. Bilateral extrarenal pelves. Left colonic diverticulosis.  No active diverticulitis. Aortic atherosclerosis. Electronically Signed   By: Rolm Baptise M.D.   On: 10/28/2020 15:08   CT Maxillofacial Wo Contrast  Result Date: 10/28/2020 CLINICAL DATA:  Facial trauma, unwitnessed fall, swelling to chin EXAM: CT HEAD WITHOUT CONTRAST CT MAXILLOFACIAL WITHOUT CONTRAST TECHNIQUE: Multidetector CT imaging of the head and maxillofacial structures were performed using the standard protocol without intravenous contrast. Multiplanar CT image reconstructions of the maxillofacial structures were also generated. Right side of face marked with BB. COMPARISON:  CT head 12/02/2006 FINDINGS: CT HEAD FINDINGS Brain: Generalized atrophy. Normal ventricular morphology. No midline shift or mass effect. Small vessel chronic ischemic changes of deep cerebral white matter. No intracranial hemorrhage, mass lesion, evidence of acute infarction, or extra-axial fluid collection. Vascular: No hyperdense vessels. Minimal atherosclerotic calcification of internal carotid arteries at skull base Skull: Intact Other: N/A CT MAXILLOFACIAL FINDINGS Osseous: TMJ alignment normal bilaterally. No facial bone fractures. Scattered dental caries and maxillary/mandibular periodontal disease. Degenerative disc and facet disease changes at visualized cervical spine. Orbits: Bony orbits intact. Intraorbital soft tissue planes clear without gas or fluid. Sinuses: Paranasal sinuses, mastoid air cells, and middle ear cavities clear bilaterally. Soft tissues: Soft tissue swelling overlying mandible especially on LEFT. Atherosclerotic calcifications at the carotid bifurcations bilaterally. IMPRESSION: Atrophy with small vessel chronic ischemic changes of deep cerebral white matter. No acute intracranial abnormalities. No acute facial bone abnormalities. Scattered  dental caries and periodontal disease changes. Electronically Signed   By: Elta Guadeloupe  Thornton Papas M.D.   On: 10/28/2020 12:42   ASSESSMENT AND PLAN:  Cole VANDIVER Sr. is a 80 y.o. 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  Sepsis:POA --now resolved --source suspected dental caries --some left lower mandibular swelling -- Patient meets criteria for sepsis with leukocytosis with WBC 12.3, tachycardia with heart rate of 104, tachypnea with RR 32.   --Lactic acid is normal. Procalcitonin <0.01 --  Currently hemodynamically  -- Urinalysis negative.  Chest x-ray negative.   --BC neg --no fever today -wbc 11k --change to IV unasyn   Periodontal disease and dental caries: -Patient will need to follow-up with dentist --pt has implants   Diabetes mellitus without complication [facility]): Recent A1c 7.2, poorly controlled.  Patient taking metformin and glipizide -SSI  Hyperlipidemia -Crestor  Hypertension -will resume at d/c if BP stable bp soft today--holding bp meds  Dementia ([place]): No behavior change -Continue Namenda  Fall: No acute injury -PT/OT--recommends Home HH  Acute metabolic encephalopathy: Likely due to sepsis. -mentation back to baseline per wife. Pt has some confusion due to his chronic dementia  Acute urinary retention--?prostate enlargement -Foley catheter --d/c it --flomax started. 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 If remains stable discharged tomorrow back to home with home health. Wife agreeable.  Dispo: The patient is from: Home  Anticipated d/c is to: Home  Patient currently is not medically stable to d/c.              Difficult to place patient No  Level of care: Med-Surg        TOTAL TIME TAKING CARE OF THIS PATIENT: *35* minutes.  >50% time spent on counselling and coordination of care  Note: This dictation was prepared with Dragon dictation along with smaller phrase technology. Any transcriptional errors that result from this process are unintentional.  Fritzi Mandes M.D    [facility]   CC: Primary care physician; Derinda Late, MDPatient ID: Cole Commander., male   DOB: Nov 25, 1940, 80 y.o.   MRN: [id number]

## 2020-10-29 NOTE — Progress Notes (Signed)
   10/29/20 0920  Clinical Encounter Type  Visited With Patient and family together  Visit Type Initial;Spiritual support;Social support  Referral From Nurse  Consult/Referral To Chaplain   Chaplain responded to a OR consult. Chaplain visited with PT and his wife. His wife stated he suffered from dementia. Chaplain did AD education with Pt and his wife. The wife said she would take the AD documents home and complete it, and she wanted to speak to the person who would be added on the documentation first, and then return forms.

## 2020-10-30 DIAGNOSIS — E119 Type 2 diabetes mellitus without complications: Secondary | ICD-10-CM

## 2020-10-30 LAB — URINE CULTURE: Culture: NO GROWTH

## 2020-10-30 LAB — GLUCOSE, CAPILLARY
Glucose-Capillary: 141 mg/dL — ABNORMAL HIGH (ref 70–99)
Glucose-Capillary: 156 mg/dL — ABNORMAL HIGH (ref 70–99)

## 2020-10-30 MED ORDER — AMOXICILLIN-POT CLAVULANATE 875-125 MG PO TABS
1.0000 | ORAL_TABLET | Freq: Two times a day (BID) | ORAL | Status: DC
  Administered 2020-10-30: 11:00:00 1 via ORAL
  Filled 2020-10-30: qty 1

## 2020-10-30 MED ORDER — TAMSULOSIN HCL 0.4 MG PO CAPS: 0.4000 mg | ORAL_CAPSULE | Freq: Every day | ORAL | 0 refills | Status: DC

## 2020-10-30 MED ORDER — AMOXICILLIN-POT CLAVULANATE 875-125 MG PO TABS: 1.0000 | ORAL_TABLET | Freq: Two times a day (BID) | ORAL | 0 refills | Status: AC

## 2020-10-30 NOTE — TOC Progression Note (Signed)
Transition of Care (TOC) - Progression Note    Patient Details  Name: ATHEL MERRIWEATHER Sr. MRN: [id number] Date of Birth: Nov 06, 1940  Transition of Care [facility]) CM/SW Contact  Shelbie Hutching, RN Phone Number: 10/30/2020, 12:42 PM  Clinical Narrative:    [facility] has accepted patient for RN, PT, and OT.  They will be able to see him Sunday or Monday.     Expected Discharge Plan: [place] Barriers to Discharge: Barriers Resolved  Expected Discharge Plan and Services Expected Discharge Plan: [place]   Discharge Planning Services: CM Consult Post Acute Care Choice: [place] arrangements for the past 2 months: Single Family Home Expected Discharge Date: 10/30/20               DME Arranged:  (none recommended by PT OT)         HH Arranged: PT,OT [place]: [facility] Date [place]: 10/30/20 Time [place]: 3893 Representative spoke with at [place]: [place]   Social Determinants of Health ([place]) Interventions    Readmission Risk Interventions No flowsheet data found.

## 2020-10-30 NOTE — Discharge Instructions (Signed)
Foley care per Nursing instruction  Please see your Dentist regarding your dental caries

## 2020-10-30 NOTE — Care Management Important Message (Signed)
Important Message  Patient Details  Name: Cole BRUSCHI Sr. MRN: [id number] Date of Birth: January 15, 1941   Medicare Important Message Given:  N/A - LOS <3 / Initial given by admissions     Cole Mccarty 10/30/2020, 7:49 AM

## 2020-10-30 NOTE — TOC Transition Note (Signed)
Transition of Care [facility]) - CM/SW Discharge Note   Patient Details  Name: Cole MCBURNEY Sr. MRN: [id number] Date of Birth: 07-02-1941  Transition of Care [facility]) CM/SW Contact:  Pete Pelt, RN Phone Number: 10/30/2020, 11:04 AM   Clinical Narrative:   TOC in to see patient and wife at bedside.  Patient has diagnosis of dementia, but oriented to self and situation, states "I am going home and I can't wait.".  Spoke with wife, she is sole caregiver, with occasional help from adult children.  States she has a church family, who she can rely on for emotional support.  Spouse has health issues and is concerned about foley catheter and continued care.  Care RN at bedside, will train spouse on foley catheter care at home. Spouse voices no current concerns about getting patient to appointments and getting medications/supplies as needed.    Spouse was tearful and asked for resources.  Phone numbers to [facility] [place] and [employer] given to spouse for resources.  TOC emphasized asking for/recieving assistance when offered by adult children or church family.    Current recommendations are for Home Health PT/OT/Nurse, [place] contacted for discharge, spouse amenable to plan.  TOC contact information given.   Final next level of care: [place] Barriers to Discharge: Barriers Resolved   Patient Goals and CMS Choice     Choice offered to / list presented to : NA  Discharge Placement                       Discharge Plan and Services   Discharge Planning Services: CM Consult Post Acute Care Choice: Home Health          DME Arranged:  (none recommended by PT OT)         HH Arranged: PT,OT [place]: [facility] Date [place]: 10/30/20 Time [place]: 3300 Representative spoke with at [place]: [place]  Social Determinants of Health ([place]) Interventions     Readmission  Risk Interventions No flowsheet data found.

## 2020-10-30 NOTE — Discharge Summary (Signed)
[place] [facility] [place] NAME: Aram Domzalski    MR#:  [id number]  DATE OF BIRTH:  Apr 22, 1941  DATE OF ADMISSION:  10/28/2020 ADMITTING PHYSICIAN: Ivor Costa, MD  DATE OF DISCHARGE: 10/30/2020  PRIMARY CARE PHYSICIAN: Derinda Late, MD    ADMISSION DIAGNOSIS:  Weakness [R53.1] Sepsis ([place]) [A41.9] Sepsis without acute organ dysfunction, due to unspecified organism ([place]) [A41.9]  DISCHARGE DIAGNOSIS:  Dental caries Acute Urinary Retention--now has Foley. Suspect BPH Sepsis--resolved SECONDARY DIAGNOSIS:   Past Medical History:  Diagnosis Date  . Cancer (HCC)    lip cancer  . Complete heart block ([place])   . Diabetes mellitus without complication ([place])   . GERD (gastroesophageal reflux disease)   . Hard of hearing    severe  . Hyperlipidemia   . Hypertension   . PVD (peripheral vascular disease) [facility])     HOSPITAL COURSE:  BRYCESON GRAPE Sr.is a 80 y.o.malewith medical history significant ofhypertension, hyperlipidemia, diabetes mellitus, GERD, complete heart block (s/p of pacemaker placement), liver cancer, hard of hearing, dementia, who presents with fever, chills, fall, altered mental status, urinary retention.  Sepsis:POA --now resolved --source suspected dental caries --some left lower mandibular swelling --Patient meets criteria for sepsis with leukocytosis with WBC 12.3, tachycardia with heart rate of 104, tachypnea with RR 32.  --Lactic acid is normal. Procalcitonin <0.01 -- Currently hemodynamically stable --Urinalysis negative. Chest x-ray negative.  --BC neg --no fever today -wbc 11k --change to IV unasyn --now on po augmentin  Periodontal diseaseand dental caries: -Patient will need to follow-up with dentist--wife informed --pt has dental implants   Diabetes mellitus without complication (HCC):Recent A1c 7.2, poorly controlled. Patient taking metformin and glipizide--now  resuemd -SSI  Hyperlipidemia -Crestor  Hypertension -on amlodipine and losartan/HCTZ  Dementia ([place]): No behavior change -Continue Namenda  Fall:No acute injury -PT/OT--recommends Home HH/PT/OT  Acute metabolic encephalopathy:Likely due to sepsis. -mentation back to baseline per wife. Pt has some confusion due to his chronic dementia  Acute urinary retention--?prostate enlargement -Foley catheter --d/ced it--re-inserted since retaining urine. --flomax started. --pt will follow urology on 11/02/2020--appt made  [place] kidneynoted on CT Renal -Patient will need urology referral at discharge  DVT ppx: SQ Lovenox Code Status:Full code Family Communication: Yes, patient'swifeat bed side Disposition Plan: Anticipate discharge back to previous environment Consults called:none Admission status and Level of care:Med-Surg: as inpt    Status is: Inpatient  Remains inpatient appropriate because:Inpatient level of care appropriate due to severity of illness  monitor for one more day. If remains stable discharged tomorrow back to home with home health. Wife agreeable.  Dispo: The patient is from:Home Anticipated d/c is JM:EQAS Patient currently is medically best optimized and stable to d/c. Difficult to place patient No  Level of care: Med-Surg     CONSULTS OBTAINED:    DRUG ALLERGIES:   Allergies  Allergen Reactions  . Ezetimibe Other (See Comments)    unknown  . Niacin Other (See Comments)    Unknown  . Niacin-Lovastatin Er     Unknown  . Simvastatin     Unknown    DISCHARGE MEDICATIONS:   Allergies as of 10/30/2020      Reactions   Ezetimibe Other (See Comments)   unknown   Niacin Other (See Comments)   Unknown   Niacin-lovastatin Er    Unknown   Simvastatin    Unknown      Medication List    STOP taking these medications   cephALEXin 250  MG capsule Commonly known as:  Keflex     TAKE these medications   amLODipine 5 MG tablet Commonly known as: NORVASC Take 5 mg by mouth daily.   amoxicillin-clavulanate 875-125 MG tablet Commonly known as: AUGMENTIN Take 1 tablet by mouth every 12 (twelve) hours for 5 days.   aspirin EC 81 MG tablet Take 81 mg by mouth daily.   CENTRUM PO Take 1 tablet by mouth daily.   glipiZIDE 2.5 MG 24 hr tablet Commonly known as: GLUCOTROL XL Take 1 tablet by mouth daily.   loratadine 10 MG tablet Commonly known as: CLARITIN Take 10 mg by mouth daily.   losartan-hydrochlorothiazide 100-25 MG tablet Commonly known as: HYZAAR Take 1 tablet by mouth daily.   LUTEIN 20 PO Take 1 tablet by mouth daily.   memantine 10 MG tablet Commonly known as: NAMENDA Take 10 mg by mouth 2 (two) times daily.   metFORMIN 1000 MG tablet Commonly known as: GLUCOPHAGE Take 1,000 mg by mouth 2 (two) times daily.   rosuvastatin 20 MG tablet Commonly known as: CRESTOR Take 20 mg by mouth daily.   tamsulosin 0.4 MG Caps capsule Commonly known as: FLOMAX Take 1 capsule (0.4 mg total) by mouth daily. Start taking on: October 31, 2020       If you experience worsening of your admission symptoms, develop shortness of breath, life threatening emergency, suicidal or homicidal thoughts you must seek medical attention immediately by calling 911 or calling your MD immediately  if symptoms less severe.  You Must read complete instructions/literature along with all the possible adverse reactions/side effects for all the Medicines you take and that have been prescribed to you. Take any new Medicines after you have completely understood and accept all the possible adverse reactions/side effects.   Please note  You were cared for by a hospitalist during your hospital stay. If you have any questions about your discharge medications or the care you received while you were in the hospital after you are discharged, you can call the unit and asked  to speak with the hospitalist on call if the hospitalist that took care of you is not available. Once you are discharged, your primary care physician will handle any further medical issues. Please note that NO REFILLS for any discharge medications will be authorized once you are discharged, as it is imperative that you return to your primary care physician (or establish a relationship with a primary care physician if you do not have one) for your aftercare needs so that they can reassess your need for medications and monitor your lab values. Today   SUBJECTIVE   Sitting int he chair. Ate good BF Wife in the room  VITAL SIGNS:  Blood pressure 131/61, pulse 78, temperature 99 F (37.2 C), temperature source Oral, resp. rate 16, height 5\' 6"  (1.676 m), weight 65 kg, SpO2 97 %.  I/O:    Intake/Output Summary (Last 24 hours) at 10/30/2020 1022 Last data filed at 10/30/2020 0534 Gross per 24 hour  Intake 1550 ml  Output 2558 ml  Net -1008 ml    PHYSICAL EXAMINATION:  GENERAL:  81 y.o.-year-old patient lying in the bed with no acute distress.  LUNGS: Normal breath sounds bilaterally, no wheezing, rales,rhonchi or crepitation. No use of accessory muscles of respiration.  CARDIOVASCULAR: S1, S2 normal. No murmurs, rubs, or gallops.  ABDOMEN: Soft, non-tender, non-distended. Bowel sounds present. No organomegaly or mass. FOLEY+ EXTREMITIES: No pedal edema, cyanosis, or clubbing.  NEUROLOGIC: Cranial nerves  II through XII are intact. Muscle strength 5/5 in all extremities. Sensation intact. Gait not checked.  PSYCHIATRIC: The patient is alert and awake. Some baseline confusion due to dementia, hard on hearing SKIN: No obvious rash, lesion, or ulcer.   DATA REVIEW:   CBC  Recent Labs  Lab 10/29/20 0437  WBC 11.0*  HGB 13.1  HCT 39.6  PLT 162    Chemistries  Recent Labs  Lab 10/28/20 1200 10/29/20 0437  NA 138 141  K 3.5 3.5  CL 100 107  CO2 25 25  GLUCOSE 113* 112*  BUN 20 18   CREATININE 1.11 1.10  CALCIUM 9.7 8.9  AST 19  --   ALT 16  --   ALKPHOS 54  --   BILITOT 1.7*  --     Microbiology Results   Recent Results (from the past 240 hour(s))  Resp Panel by RT-PCR (Flu A&B, Covid) Nasopharyngeal Swab     Status: None   Collection Time: 10/28/20 12:00 PM   Specimen: Nasopharyngeal Swab; Nasopharyngeal(NP) swabs in vial transport medium  Result Value Ref Range Status   SARS Coronavirus 2 by RT PCR NEGATIVE NEGATIVE Final    Comment: (NOTE) SARS-CoV-2 target nucleic acids are NOT DETECTED.  The SARS-CoV-2 RNA is generally detectable in upper respiratory specimens during the acute phase of infection. The lowest concentration of SARS-CoV-2 viral copies this assay can detect is 138 copies/mL. A negative result does not preclude SARS-Cov-2 infection and should not be used as the sole basis for treatment or other patient management decisions. A negative result may occur with  improper specimen collection/handling, submission of specimen other than nasopharyngeal swab, presence of viral mutation(s) within the areas targeted by this assay, and inadequate number of viral copies(<138 copies/mL). A negative result must be combined with clinical observations, patient history, and epidemiological information. The expected result is Negative.  Fact Sheet for Patients:  EntrepreneurPulse.com.au  Fact Sheet for Healthcare Providers:  IncredibleEmployment.be  This test is no t yet approved or cleared by the [place] FDA and  has been authorized for detection and/or diagnosis of SARS-CoV-2 by FDA under an Emergency Use Authorization (EUA). This EUA will remain  in effect (meaning this test can be used) for the duration of the COVID-19 declaration under Section 564(b)(1) of the Act, 21 U.S.C.section 360bbb-3(b)(1), unless the authorization is terminated  or revoked sooner.       Influenza A by PCR NEGATIVE NEGATIVE Final    Influenza B by PCR NEGATIVE NEGATIVE Final    Comment: (NOTE) The Xpert Xpress SARS-CoV-2/FLU/RSV plus assay is intended as an aid in the diagnosis of influenza from Nasopharyngeal swab specimens and should not be used as a sole basis for treatment. Nasal washings and aspirates are unacceptable for Xpert Xpress SARS-CoV-2/FLU/RSV testing.  Fact Sheet for Patients: EntrepreneurPulse.com.au  Fact Sheet for Healthcare Providers: IncredibleEmployment.be  This test is not yet approved or cleared by the [place] FDA and has been authorized for detection and/or diagnosis of SARS-CoV-2 by FDA under an Emergency Use Authorization (EUA). This EUA will remain in effect (meaning this test can be used) for the duration of the COVID-19 declaration under Section 564(b)(1) of the Act, 21 U.S.C. section 360bbb-3(b)(1), unless the authorization is terminated or revoked.  Performed at [facility], [place] [place],  [address]   Blood Culture (routine x 2)     Status: None (Preliminary result)   Collection Time: 10/28/20 12:00 PM   Specimen: BLOOD  Result Value Ref Range Status   Specimen Description BLOOD RIGHT AC  Final   Special Requests   Final    BOTTLES DRAWN AEROBIC AND ANAEROBIC Blood Culture results may not be optimal due to an inadequate volume of blood received in culture bottles   Culture   Final    NO GROWTH 2 DAYS Performed at [facility], [address], [place], [place] [address]    Report Status PENDING  Incomplete  Blood Culture (routine x 2)     Status: None (Preliminary result)   Collection Time: 10/28/20 12:40 PM   Specimen: BLOOD  Result Value Ref Range Status   Specimen Description BLOOD BLOOD RIGHT ARM  Final   Special Requests   Final    BOTTLES DRAWN AEROBIC AND ANAEROBIC Blood Culture adequate volume   Culture   Final    NO GROWTH 2 DAYS Performed at [facility], [address] [place], [place] [address]    Report Status PENDING  Incomplete  Urine culture     Status: None   Collection Time: 10/28/20  1:49 PM   Specimen: In/Out Cath Urine  Result Value Ref Range Status   Specimen Description   Final    IN/OUT CATH URINE Performed at [facility], [address], [place], [place] [address]    Special Requests   Final    NONE Performed at [facility], [address], [place], [place] [address]    Culture   Final    NO GROWTH Performed at [facility], [place] [address], [place], [place] [address]    Report Status 10/30/2020 FINAL  Final    RADIOLOGY:  CT Head Wo Contrast  Result Date: 10/28/2020 CLINICAL DATA:  Facial trauma, unwitnessed fall, swelling to chin EXAM: CT HEAD WITHOUT CONTRAST CT MAXILLOFACIAL WITHOUT CONTRAST TECHNIQUE: Multidetector CT imaging of the head and maxillofacial structures were performed using the standard protocol without intravenous contrast. Multiplanar CT image reconstructions of the maxillofacial structures were also generated. Right side of face marked with BB. COMPARISON:  CT head 12/02/2006 FINDINGS: CT HEAD FINDINGS Brain: Generalized atrophy. Normal ventricular morphology. No midline shift or mass effect. Small vessel chronic ischemic changes of deep cerebral white matter. No intracranial hemorrhage, mass lesion, evidence of acute infarction, or extra-axial fluid collection. Vascular: No hyperdense vessels. Minimal atherosclerotic calcification of internal carotid arteries at skull base Skull: Intact Other: N/A CT MAXILLOFACIAL FINDINGS Osseous: TMJ alignment normal bilaterally. No facial bone fractures. Scattered dental caries and maxillary/mandibular periodontal disease. Degenerative disc and facet disease changes at visualized cervical spine. Orbits: Bony orbits intact. Intraorbital soft tissue planes clear without gas or fluid. Sinuses: Paranasal sinuses, mastoid air cells, and middle ear cavities  clear bilaterally. Soft tissues: Soft tissue swelling overlying mandible especially on LEFT. Atherosclerotic calcifications at the carotid bifurcations bilaterally. IMPRESSION: Atrophy with small vessel chronic ischemic changes of deep cerebral white matter. No acute intracranial abnormalities. No acute facial bone abnormalities. Scattered dental caries and periodontal disease changes. Electronically Signed   By: Lavonia Dana M.D.   On: 10/28/2020 12:42   DG Chest Port 1 View  Addendum Date: 10/28/2020   ADDENDUM REPORT: 10/28/2020 13:17 ADDENDUM: Carotid vascular calcification consistent with carotid atherosclerotic vascular disease incidentally noted. Electronically Signed   By: Marcello Moores  Register   On: 10/28/2020 13:17   Result Date: 10/28/2020 CLINICAL DATA:  Questionable sepsis. EXAM: PORTABLE CHEST 1 VIEW COMPARISON:  Chest x-ray 05/12/2019. FINDINGS: Cardiac pacer stable position. Heart  size upper limits normal. No pulmonary venous congestion. Low lung volumes with mild bibasilar atelectasis. No pleural effusion or pneumothorax. IMPRESSION: 1. Cardiac pacer stable position. Heart size upper limits normal. No pulmonary venous congestion. 2.  Low lung volumes with bibasilar atelectasis. Electronically Signed: By: Marcello Moores  Register On: 10/28/2020 13:07   CT Renal Stone Study  Result Date: 10/28/2020 CLINICAL DATA:  Flank pain EXAM: CT ABDOMEN AND PELVIS WITHOUT CONTRAST TECHNIQUE: Multidetector CT imaging of the abdomen and pelvis was performed following the standard protocol without IV contrast. COMPARISON:  None. FINDINGS: Lower chest: Lung bases are clear. No effusions. Heart is normal size. Hepatobiliary: No focal hepatic abnormality. Gallbladder unremarkable. Pancreas: No focal abnormality or ductal dilatation. Spleen: No focal abnormality.  Normal size. Adrenals/Urinary Tract: Horseshoe kidney. Right renal stone measures 3 mm. Left lower pole renal stone measures 8 mm. Mild fullness of the renal  pelves bilaterally, likely extrarenal pelves. No ureteral stones. Foley catheter in place with bladder decompressed. Multiple low-density lesions within the kidneys, the largest 3.5 cm in the right kidney. Stomach/Bowel: Left colonic diverticulosis. No active diverticulitis. Normal appendix. Moderate stool burden throughout the colon. No bowel obstruction. Vascular/Lymphatic: Diffuse aortoiliac atherosclerosis. No evidence of aneurysm or adenopathy. Reproductive: No visible focal abnormality. Other: No free fluid or free air. Small left inguinal hernia containing fat and a loop of small bowel. No bowel obstruction. Musculoskeletal: No acute bony abnormality. IMPRESSION: Horseshoe kidney. Bilateral nephrolithiasis. No visible ureteral stones. Bilateral extrarenal pelves. Left colonic diverticulosis.  No active diverticulitis. Aortic atherosclerosis. Electronically Signed   By: Rolm Baptise M.D.   On: 10/28/2020 15:08   CT Maxillofacial Wo Contrast  Result Date: 10/28/2020 CLINICAL DATA:  Facial trauma, unwitnessed fall, swelling to chin EXAM: CT HEAD WITHOUT CONTRAST CT MAXILLOFACIAL WITHOUT CONTRAST TECHNIQUE: Multidetector CT imaging of the head and maxillofacial structures were performed using the standard protocol without intravenous contrast. Multiplanar CT image reconstructions of the maxillofacial structures were also generated. Right side of face marked with BB. COMPARISON:  CT head 12/02/2006 FINDINGS: CT HEAD FINDINGS Brain: Generalized atrophy. Normal ventricular morphology. No midline shift or mass effect. Small vessel chronic ischemic changes of deep cerebral white matter. No intracranial hemorrhage, mass lesion, evidence of acute infarction, or extra-axial fluid collection. Vascular: No hyperdense vessels. Minimal atherosclerotic calcification of internal carotid arteries at skull base Skull: Intact Other: N/A CT MAXILLOFACIAL FINDINGS Osseous: TMJ alignment normal bilaterally. No facial bone  fractures. Scattered dental caries and maxillary/mandibular periodontal disease. Degenerative disc and facet disease changes at visualized cervical spine. Orbits: Bony orbits intact. Intraorbital soft tissue planes clear without gas or fluid. Sinuses: Paranasal sinuses, mastoid air cells, and middle ear cavities clear bilaterally. Soft tissues: Soft tissue swelling overlying mandible especially on LEFT. Atherosclerotic calcifications at the carotid bifurcations bilaterally. IMPRESSION: Atrophy with small vessel chronic ischemic changes of deep cerebral white matter. No acute intracranial abnormalities. No acute facial bone abnormalities. Scattered dental caries and periodontal disease changes. Electronically Signed   By: Lavonia Dana M.D.   On: 10/28/2020 12:42     CODE STATUS:     Code Status Orders  (From admission, onward)         Start     Ordered   10/28/20 1528  Full code  Continuous        10/28/20 1528        Code Status History    This patient has a current code status but no historical code status.   Advance Care Planning Activity  TOTAL TIME TAKING CARE OF THIS PATIENT: *40* minutes.    Fritzi Mandes M.D  [facility]    CC: Primary care physician; Derinda Late, MD

## 2020-11-02 ENCOUNTER — Encounter: Payer: Self-pay | Admitting: Urology

## 2020-11-02 ENCOUNTER — Ambulatory Visit: Payer: Self-pay | Admitting: Physician Assistant

## 2020-11-02 ENCOUNTER — Ambulatory Visit (INDEPENDENT_AMBULATORY_CARE_PROVIDER_SITE_OTHER): Payer: Medicare PPO | Admitting: Urology

## 2020-11-02 ENCOUNTER — Other Ambulatory Visit: Payer: Self-pay

## 2020-11-02 VITALS — BP 155/75 | HR 83 | Ht 66.0 in | Wt 162.0 lb

## 2020-11-02 DIAGNOSIS — R339 Retention of urine, unspecified: Secondary | ICD-10-CM | POA: Diagnosis not present

## 2020-11-02 LAB — CULTURE, BLOOD (ROUTINE X 2)
Culture: NO GROWTH
Culture: NO GROWTH
Special Requests: ADEQUATE

## 2020-11-02 LAB — BLADDER SCAN AMB NON-IMAGING

## 2020-11-02 NOTE — Addendum Note (Signed)
Addended by: Donalee Citrin on: 11/02/2020 03:01 PM   Modules accepted: Orders

## 2020-11-02 NOTE — Progress Notes (Signed)
11/02/2020 10:57 AM   Cole Iha Sr. 1940-09-24 [id number]  Referring provider: Derinda Late, MD [phone] [facility] [place],  [place] [address]  Chief Complaint  Patient presents with  . Urinary Retention    HPI: Patient was added on to clinic today with retention.  His dementia.  His wife was helpful.  She said it is difficult to know if he has good flow but she thinks he strained some.  Gets up at least twice a night.  He is on oral hypoglycemics.  No history of infections or bladder surgery.  He was in the hospital last week and had a catheter.  He is hard of hearing.  He has cardiac comorbidities.  He has diabetes.  He had retention.  It was felt that the source of the sepsis may have been dental.  He may have had an unsuccessful trial of voiding was placed on Flomax.  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  Urine culture negative   PMH: Past Medical History:  Diagnosis Date  . Cancer (HCC)    lip cancer  . Complete heart block ([place])   . Diabetes mellitus without complication ([place])   . GERD (gastroesophageal reflux disease)   . Hard of hearing    severe  . Hyperlipidemia   . Hypertension   . PVD (peripheral vascular disease) [facility])     Surgical History: Past Surgical History:  Procedure Laterality Date  . COLONOSCOPY    . COLONOSCOPY WITH PROPOFOL N/A 02/12/2018   Procedure: COLONOSCOPY WITH PROPOFOL;  Surgeon: Lollie Sails, MD;  Location: [facility] ENDOSCOPY;  Service: Endoscopy;  Laterality: N/A;  . insert/replace/pacemaker    . PACEMAKER INSERTION N/A 08/07/2019   Procedure: PACEMAKER CHANGE OUT;  Surgeon: Isaias Cowman, MD;  Location: [facility] ORS;  Service: Cardiovascular;  Laterality: N/A;  . SURGERY OF LIP      Home Medications:  Allergies as of 11/02/2020      Reactions   Ezetimibe Other (See Comments)   unknown   Niacin Other (See Comments)   Unknown    Niacin-lovastatin Er    Unknown   Simvastatin    Unknown      Medication List       Accurate as of November 02, 2020 10:57 AM. If you have any questions, ask your nurse or doctor.        amLODipine 5 MG tablet Commonly known as: NORVASC Take 5 mg by mouth daily.   amoxicillin-clavulanate 875-125 MG tablet Commonly known as: AUGMENTIN Take 1 tablet by mouth every 12 (twelve) hours for 5 days.   aspirin EC 81 MG tablet Take 81 mg by mouth daily.   CENTRUM PO Take 1 tablet by mouth daily.   glipiZIDE 2.5 MG 24 hr tablet Commonly known as: GLUCOTROL XL Take 1 tablet by mouth daily.   loratadine 10 MG tablet Commonly known as: CLARITIN Take 10 mg by mouth daily.   losartan-hydrochlorothiazide 100-25 MG tablet Commonly known as: HYZAAR Take 1 tablet by mouth daily.   LUTEIN 20 PO Take 1 tablet by mouth daily.   memantine 10 MG tablet Commonly known as: NAMENDA Take 10 mg by mouth 2 (two) times daily.   metFORMIN 1000 MG tablet Commonly known as: GLUCOPHAGE Take 1,000 mg by mouth 2 (two) times daily.   rosuvastatin 20 MG tablet Commonly known as: CRESTOR Take 20 mg by mouth daily.  tamsulosin 0.4 MG Caps capsule Commonly known as: FLOMAX Take 1 capsule (0.4 mg total) by mouth daily.       Allergies:  Allergies  Allergen Reactions  . Ezetimibe Other (See Comments)    unknown  . Niacin Other (See Comments)    Unknown  . Niacin-Lovastatin Er     Unknown  . Simvastatin     Unknown    Family History: Family History  Problem Relation Age of Onset  . Stroke Sister   . Lung disease Sister        s/p of transplantation    Social History:  reports that he quit smoking about 31 years ago. His smoking use included cigarettes. He quit after 30.00 years of use. He has never used smokeless tobacco. He reports previous alcohol use. He reports previous drug use.  ROS:                                        Physical Exam: BP (!)  155/75   Pulse 83   Ht 5\' 6"  (1.676 m)   Wt 73.5 kg   BMI 26.15 kg/m   Constitutional:  Alert and oriented, No acute distress. HEENT: [place] AT, moist mucus membranes.  Trachea midline, no masses. Cardiovascular: No clubbing, cyanosis, or edema. Respiratory: Normal respiratory effort, no increased work of breathing. GI: Abdomen is soft, nontender, nondistended, no abdominal masses GU: No CVA tenderness.  Foley catheter in place.  50 g or larger benign prostate Skin: No rashes, bruises or suspicious lesions. Lymph: No cervical or inguinal adenopathy. Neurologic: Grossly intact, no focal deficits, moving all 4 extremities. Psychiatric: Normal mood and affect.  Laboratory Data: Lab Results  Component Value Date   WBC 11.0 (H) 10/29/2020   HGB 13.1 10/29/2020   HCT 39.6 10/29/2020   MCV 90.6 10/29/2020   PLT 162 10/29/2020    Lab Results  Component Value Date   CREATININE 1.10 10/29/2020    No results found for: PSA  No results found for: TESTOSTERONE  No results found for: HGBA1C  Urinalysis    Component Value Date/Time   COLORURINE YELLOW (A) 10/28/2020 1349   APPEARANCEUR CLEAR (A) 10/28/2020 1349   LABSPEC 1.011 10/28/2020 1349   PHURINE 5.0 10/28/2020 1349   GLUCOSEU NEGATIVE 10/28/2020 1349   HGBUR MODERATE (A) 10/28/2020 1349   BILIRUBINUR NEGATIVE 10/28/2020 1349   KETONESUR NEGATIVE 10/28/2020 1349   PROTEINUR NEGATIVE 10/28/2020 1349   NITRITE NEGATIVE 10/28/2020 1349   LEUKOCYTESUR NEGATIVE 10/28/2020 1349    Pertinent Imaging: Chart reviewed.  Assessment & Plan: Trial of voiding and see nurse practitioner later today.  It is difficult to say if this was an acute issue or if he chronically has voiding trouble.  He is now on Flomax.  Pathophysiology of retention discussed.  Currently taking stool softeners and will continue to do so  There are no diagnoses linked to this encounter.  No follow-ups on file.  Reece Packer, MD  [facility] [address] [place], [place] [address] [phone]

## 2020-11-02 NOTE — Patient Instructions (Signed)
Indwelling Urinary Catheter Care, Adult An indwelling urinary catheter is a thin, flexible, germ-free (sterile) tube that is placed into the bladder to help drain urine out of the body. The catheter is inserted into the part of the body that drains urine from the bladder (urethra). Urine drains from the catheter into a drainage bag outside of the body. Taking good care of your catheter will keep it working properly and help to prevent problems from developing. What are the risks?  Bacteria may get into your bladder and cause a urinary tract infection.  Urine flow can become blocked. This can happen if the catheter is not working correctly, or if you have sediment or a blood clot in your bladder or the catheter.  Tissue near the catheter may become irritated and bleed. How to wear your catheter and your drainage bag Supplies needed  Adhesive tape or a leg strap.  Alcohol wipe or soap and water (if you use tape).  A clean towel (if you use tape).  Overnight drainage bag.  Smaller drainage bag (leg bag). Wearing your catheter and bag Use adhesive tape or a leg strap to attach your catheter to your leg.  Make sure the catheter is not pulled tight.  If a leg strap gets wet, replace it with a dry one.  If you use adhesive tape: 1. Use an alcohol wipe or soap and water to wash off any stickiness on your skin where you had tape before. 2. Use a clean towel to pat-dry the area. 3. Apply the new tape. You should have received a large overnight drainage bag and a smaller leg bag that fits underneath clothing.  You may wear the overnight bag at any time, but you should not wear the leg bag at night.  Always wear the leg bag below your knee.  Make sure the overnight drainage bag is always lower than the level of your bladder, but do not let it touch the floor. Before you go to sleep, hang the bag inside a wastebasket that is covered by a clean plastic bag. How to care for your skin around  the catheter Supplies needed  A clean washcloth.  Water and mild soap.  A clean towel. Caring for your skin and catheter  Every day, use a clean washcloth and soapy water to clean the skin around your catheter. 1. Wash your hands with soap and water. 2. Wet a washcloth in warm water and mild soap. 3. Clean the skin around your urethra.  If you are male:  Use one hand to gently spread the folds of skin around your vagina (labia).  With the washcloth in your other hand, wipe the inner side of your labia on each side. Do this in a front-to-back direction.  If you are male:  Use one hand to pull back any skin that covers the end of your penis (foreskin).  With the washcloth in your other hand, wipe your penis in small circles. Start wiping at the tip of your penis, then move outward from the catheter.  Move the foreskin back in place, if this applies. 4. With your free hand, hold the catheter close to where it enters your body. Keep holding the catheter during cleaning so it does not get pulled out. 5. Use your other hand to clean the catheter with the washcloth.  Only wipe downward on the catheter.  Do not wipe upward toward your body, because that may push bacteria into your urethra and cause infection. 6.   Use a clean towel to pat-dry the catheter and the skin around it. Make sure to wipe off all soap. 7. Wash your hands with soap and water.  Shower every day. Do not take baths.  Do not use cream, ointment, or lotion on the area where the catheter enters your body, unless your health care provider tells you to do that.  Do not use powders, sprays, or lotions on your genital area.  Check your skin around the catheter every day for signs of infection. Check for: ? Redness, swelling, or pain. ? Fluid or blood. ? Warmth. ? Pus or a bad smell.      How to empty the drainage bag Supplies needed  Rubbing alcohol.  Gauze pad or cotton ball.  Adhesive tape or a leg  strap. Emptying the bag Empty your drainage bag (your overnight drainage bag or your leg bag) when it is ?- full, or at least 2-3 times a day. Clean the drainage bag according to the manufacturer's instructions or as told by your health care provider. 1. Wash your hands with soap and water. 2. Detach the drainage bag from your leg. 3. Hold the drainage bag over the toilet or a clean container. Make sure the drainage bag is lower than your hips and bladder. This stops urine from going back into the tubing and into your bladder. 4. Open the pour spout at the bottom of the bag. 5. Empty the urine into the toilet or container. Do not let the pour spout touch any surface. This precaution is important to prevent bacteria from getting in the bag and causing infection. 6. Apply rubbing alcohol to a gauze pad or cotton ball. 7. Use the gauze pad or cotton ball to clean the pour spout. 8. Close the pour spout. 9. Attach the bag to your leg with adhesive tape or a leg strap. 10. Wash your hands with soap and water. How to change the drainage bag Supplies needed:  Alcohol wipes.  A clean drainage bag.  Adhesive tape or a leg strap. Changing the bag Replace your drainage bag with a clean bag if it leaks, starts to smell bad, or looks dirty. 1. Wash your hands with soap and water. 2. Detach the dirty drainage bag from your leg. 3. Pinch the catheter with your fingers so that urine does not spill out. 4. Disconnect the catheter tube from the drainage tube at the connection valve. Do not let the tubes touch any surface. 5. Clean the end of the catheter tube with an alcohol wipe. Use a different alcohol wipe to clean the end of the drainage tube. 6. Connect the catheter tube to the drainage tube of the clean bag. 7. Attach the clean bag to your leg with adhesive tape or a leg strap. Avoid attaching the new bag too tightly. 8. Wash your hands with soap and water. General instructions  Never pull on  your catheter or try to remove it. Pulling can damage your internal tissues.  Always wash your hands before and after you handle your catheter or drainage bag. Use a mild, fragrance-free soap. If soap and water are not available, use hand sanitizer.  Always make sure there are no twists or bends (kinks) in the catheter tube.  Always make sure there are no leaks in the catheter or drainage bag.  Drink enough fluid to keep your urine pale yellow.  Do not take baths, swim, or use a hot tub.  If you are male, wipe from   front to back after having a bowel movement.   Contact a health care provider if:  Your urine is cloudy.  Your urine smells unusually bad.  Your catheter gets clogged.  Your catheter starts to leak.  Your bladder feels full. Get help right away if:  You have redness, swelling, or pain where the catheter enters your body.  You have fluid, blood, pus, or a bad smell coming from the area where the catheter enters your body.  The area where the catheter enters your body feels warm to the touch.  You have a fever.  You have pain in your abdomen, legs, lower back, or bladder.  You see blood in the catheter.  Your urine is pink or red.  You have nausea, vomiting, or chills.  Your urine is not draining into the bag.  Your catheter gets pulled out. Summary  An indwelling urinary catheter is a thin, flexible, germ-free (sterile) tube that is placed into the bladder to help drain urine out of the body.  The catheter is inserted into the part of the body that drains urine from the bladder (urethra).  Take good care of your catheter to keep it working properly and help prevent problems from developing.  Always wash your hands before and after you handle your catheter or drainage bag.  Never pull on your catheter or try to remove it. This information is not intended to replace advice given to you by your health care provider. Make sure you discuss any questions  you have with your health care provider. Document Revised: 09/02/2019 Document Reviewed: 02/10/2017 Elsevier Patient Education  2021 [employer]  

## 2020-11-02 NOTE — Progress Notes (Signed)
Afternoon follow-up  Patient returned to clinic this afternoon for repeat PVR. He reports drinking approximately 48oz of fluid. He has not been able to urinate. PVR >834mL.  Results for orders placed or performed in visit on 11/02/20  Bladder Scan (Post Void Residual) in office  Result Value Ref Range   Scan Result >830mL     Voiding trial failed. Offered patient Foley replacement with repeat VT in 1 week; he accepted.  Follow up: Return in about 1 week (around 11/09/2020) for Voiding trial.   Simple Catheter Placement  Due to urinary retention patient is present today for a foley cath placement.  Patient was cleaned and prepped in a sterile fashion with betadine and 2% lidocaine jelly was instilled into the urethra. A 16 FR coude foley catheter was inserted, urine return was noted  567ml, urine was yellow in color.  The balloon was filled with 10cc of sterile water.  A leg bag was attached for drainage. Patient was given instruction on proper catheter care.  Patient tolerated well, no complications were noted   Performed by: Debroah Loop, PA-C

## 2020-11-02 NOTE — Progress Notes (Signed)
Fill and Pull Catheter Removal  Patient is present today for a catheter removal.  Patient was cleaned and prepped in a sterile fashion, 144ml of sterile water/ saline was instilled into the bladder when the patient felt the urge to urinate. 60ml of water was then drained from the balloon.  A 16FR foley cath was removed from the bladder no complications were noted .  Patient was then given some time to void on their own.  Patient cannot void on their own after some time.  Patient tolerated well. Patient is to return to clinic this afternoon for PVR.   Performed by: Bradly Bienenstock, CMA  Follow up/ Additional notes: RTC this afternoon for PVR with Sam.

## 2020-11-11 ENCOUNTER — Other Ambulatory Visit: Payer: Self-pay

## 2020-11-11 ENCOUNTER — Other Ambulatory Visit: Payer: Self-pay | Admitting: Family Medicine

## 2020-11-11 ENCOUNTER — Ambulatory Visit: Payer: Self-pay | Admitting: Physician Assistant

## 2020-11-11 ENCOUNTER — Ambulatory Visit
Admission: RE | Admit: 2020-11-11 | Discharge: 2020-11-11 | Disposition: A | Discharge status: Home / Self Care | Payer: Medicare PPO | Source: Ambulatory Visit | Attending: Family Medicine | Admitting: Family Medicine

## 2020-11-11 ENCOUNTER — Ambulatory Visit (INDEPENDENT_AMBULATORY_CARE_PROVIDER_SITE_OTHER): Payer: Medicare PPO | Admitting: Physician Assistant

## 2020-11-11 DIAGNOSIS — M79675 Pain in left toe(s): Secondary | ICD-10-CM | POA: Diagnosis not present

## 2020-11-11 DIAGNOSIS — R338 Other retention of urine: Secondary | ICD-10-CM

## 2020-11-11 DIAGNOSIS — M79605 Pain in left leg: Secondary | ICD-10-CM | POA: Insufficient documentation

## 2020-11-11 DIAGNOSIS — M79672 Pain in left foot: Secondary | ICD-10-CM

## 2020-11-11 DIAGNOSIS — M7989 Other specified soft tissue disorders: Secondary | ICD-10-CM | POA: Diagnosis not present

## 2020-11-11 LAB — BLADDER SCAN AMB NON-IMAGING

## 2020-11-11 NOTE — Progress Notes (Signed)
Catheter Removal  Patient is present today for a catheter removal.  9ml of water was drained from the balloon. A 16FR coude foley cath was removed from the bladder no complications were noted . Patient tolerated well.  Performed by: Lasheka Kempner, PA-C   Follow up/ Additional notes: Push fluids and RTC this afternoon for PVR. 

## 2020-11-11 NOTE — Patient Instructions (Signed)
Cystoscopy Cystoscopy is a procedure that is used to help diagnose and sometimes treat conditions that affect the lower urinary tract. The lower urinary tract includes the bladder and the urethra. The urethra is the tube that drains urine from the bladder. Cystoscopy is done using a thin, tube-shaped instrument with a light and camera at the end (cystoscope). The cystoscope may be hard or flexible, depending on the goal of the procedure. The cystoscope is inserted through the urethra, into the bladder. Cystoscopy may be recommended if you have:  Urinary tract infections that keep coming back.  Blood in the urine (hematuria).  An inability to control when you urinate (urinary incontinence) or an overactive bladder.  Unusual cells found in a urine sample.  A blockage in the urethra, such as a urinary stone.  Painful urination.  An abnormality in the bladder found during an intravenous pyelogram (IVP) or CT scan. Cystoscopy may also be done to remove a sample of tissue to be examined under a microscope (biopsy). Tell a health care provider about:  Any allergies you have.  All medicines you are taking, including vitamins, herbs, eye drops, creams, and over-the-counter medicines.  Any problems you or family members have had with anesthetic medicines.  Any blood disorders you have.  Any surgeries you have had.  Any medical conditions you have.  Whether you are pregnant or may be pregnant. What are the risks? Generally, this is a safe procedure. However, problems may occur, including:  Infection.  Bleeding.  Allergic reactions to medicines.  Damage to other structures or organs. What happens before the procedure? Medicines Ask your health care provider about:  Changing or stopping your regular medicines. This is especially important if you are taking diabetes medicines or blood thinners.  Taking medicines such as aspirin and ibuprofen. These medicines can thin your blood. Do  not take these medicines unless your health care provider tells you to take them.  Taking over-the-counter medicines, vitamins, herbs, and supplements. Tests You may have an exam or testing, such as:  X-rays of the bladder, urethra, or kidneys.  CT scan of the abdomen or pelvis.  Urine tests to check for signs of infection. General instructions  Follow instructions from your health care provider about eating or drinking restrictions.  Ask your health care provider what steps will be taken to help prevent infection. These steps may include: ? Washing skin with a germ-killing soap. ? Taking antibiotic medicine.  Plan to have a responsible adult take you home from the hospital or clinic. What happens during the procedure?  You will be given one or more of the following: ? A medicine to help you relax (sedative). ? A medicine to numb the area (local anesthetic).  The area around the opening of your urethra will be cleaned.  The cystoscope will be passed through your urethra into your bladder.  Germ-free (sterile) fluid will flow through the cystoscope to fill your bladder. The fluid will stretch your bladder so that your health care provider can clearly examine your bladder walls.  Your doctor will look at the urethra and bladder. Your doctor may take a biopsy or remove stones.  The cystoscope will be removed, and your bladder will be emptied. The procedure may vary among health care providers and hospitals.   What can I expect after the procedure? After the procedure, it is common to have:  Some soreness or pain in your abdomen and urethra.  Urinary symptoms. These include: ? Mild pain or burning  when you urinate. Pain should stop within a few minutes after you urinate. This may last for up to 1 week. ? A small amount of blood in your urine for several days. ? Feeling like you need to urinate but producing only a small amount of urine. Follow these instructions at  home: Medicines  Take over-the-counter and prescription medicines only as told by your health care provider.  If you were prescribed an antibiotic medicine, take it as told by your health care provider. Do not stop taking the antibiotic even if you start to feel better. General instructions  Return to your normal activities as told by your health care provider. Ask your health care provider what activities are safe for you.  If you were given a sedative during the procedure, it can affect you for several hours. Do not drive or operate machinery until your health care provider says that it is safe.  Watch for any blood in your urine. If the amount of blood in your urine increases, call your health care provider.  Follow instructions from your health care provider about eating or drinking restrictions.  If a tissue sample was removed for testing (biopsy) during your procedure, it is up to you to get your test results. Ask your health care provider, or the department that is doing the test, when your results will be ready.  Drink enough fluid to keep your urine pale yellow.  Keep all follow-up visits. This is important. Contact a health care provider if:  You have pain that gets worse or does not get better with medicine, especially pain when you urinate.  You have trouble urinating.  You have more blood in your urine. Get help right away if:  You have blood clots in your urine.  You have abdominal pain.  You have a fever or chills.  You are unable to urinate. Summary  Cystoscopy is a procedure that is used to help diagnose and sometimes treat conditions that affect the lower urinary tract.  Cystoscopy is done using a thin, tube-shaped instrument with a light and camera at the end.  After the procedure, it is common to have some soreness or pain in your abdomen and urethra.  Watch for any blood in your urine. If the amount of blood in your urine increases, call your health  care provider.  If you were prescribed an antibiotic medicine, take it as told by your health care provider. Do not stop taking the antibiotic even if you start to feel better. This information is not intended to replace advice given to you by your health care provider. Make sure you discuss any questions you have with your health care provider. Document Revised: 02/07/2020 Document Reviewed: 02/07/2020 Elsevier Patient Education  2021 [employer].  Transrectal Ultrasound A transrectal ultrasound is a procedure that uses sound waves to create images of the prostate gland and nearby tissues. For this procedure, an ultrasound probe is placed in the rectum. The probe sends sound waves through the wall of the rectum into the prostate gland, which is located in front of the rectum. The images show the size and shape of the prostate gland and nearby structures. You may need this test if you have:  Trouble urinating.  Trouble getting your partner pregnant (infertility).  An abnormal result from a prostate screening exam. Tell a health care provider about:  Any allergies you have.  All medicines you are taking, including vitamins, herbs, eye drops, creams, and over-the-counter medicines.  Any  blood disorders you have.  Any medical conditions you have.  Any surgeries you have had. What are the risks? Generally, this is a safe procedure. However, problems may occur, including:  Discomfort during the procedure. This is rare.  Blood in your urine or sperm after the procedure. This may occur if a sample of tissue (biopsy) is taken during the procedure. What happens before the procedure?  Your health care provider may instruct you to use an enema 1-4 hours before the procedure. Follow instructions from your health care provider about how to do the enema.  Ask your health care provider about: ? Changing or stopping your regular medicines. This is especially important if you are taking  diabetes medicines or blood thinners. ? Taking medicines such as aspirin and ibuprofen. These medicines can thin your blood. Do not take these medicines unless your health care provider tells you to take them. ? Taking over-the-counter medicines, vitamins, herbs, and supplements. What happens during the procedure?  You will be asked to lie down on your left side on an exam table.  You will bend your knees toward your chest.  Gel will be put on a probe, and the probe will be gently inserted into your rectum. This may cause a feeling of fullness.  The probe will send signals to a computer that will create images. These will be displayed on a monitor that looks like a small television screen.  The technician will slightly rotate the probe throughout the procedure. While rotating the probe, he or she will view and capture images of the prostate gland and the surrounding structures from different angles.  Your health care provider may take a biopsy sample of prostate tissue during the procedure. The images captured from the ultrasound will help guide the needle used to remove a sample of tissue. The sample will be sent to a lab for testing.  The probe will be removed. The procedure may vary among health care providers and hospitals. What can I expect after the procedure?  It is up to you to get the results of your procedure. Ask your health care provider, or the department that is doing the procedure, when your results will be ready.  Keep all follow-up visits as told by your health care provider. This is important. Summary  A transrectal ultrasound is a procedure that uses sound waves to create images of the prostate gland and nearby tissues.  The images show the size and shape of the prostate gland and nearby structures.  Before the procedure, ask your health care provider about changing or stopping your regular medicines. This is especially important if you are taking diabetes medicines  or blood thinners. This information is not intended to replace advice given to you by your health care provider. Make sure you discuss any questions you have with your health care provider. Document Revised: 08/21/2019 Document Reviewed: 08/21/2019 Elsevier Patient Education  2021 [employer].

## 2020-11-11 NOTE — Progress Notes (Signed)
Simple Catheter Placement  Due to urinary retention patient is present today for a foley cath placement.  Patient was cleaned and prepped in a sterile fashion with betadine. A 16 FR foley catheter was inserted, urine return was noted  633ml, urine was yellow in color.  The balloon was filled with 10cc of sterile water.  A leg bag was attached for drainage. Patient was also given a night bag to take home and was given instruction on how to change from one bag to another.  Patient was given instruction on proper catheter care.  Patient tolerated well, no complications were noted   Performed by: Debroah Loop, PA-C

## 2020-11-11 NOTE — Progress Notes (Signed)
11/11/2020 4:22 PM   Cole Iha Sr. March 09, 1941 [id number]  CC: Chief Complaint  Patient presents with  . Urinary Retention    HPI: Cole KAKOS Sr. is a 80 y.o. male with PMH DM2, dementia, and horseshoe kidney who recently developed urinary retention during hospitalization for sepsis with suspected source of dental caries who presents today for repeat voiding trial on Flomax. He failed his first outpatient voiding trial in clinic 9 days ago.  He is accompanied today by his wife and son, who contributes to HPI.  Today his wife reports his catheter has been extremely bothersome, however the patient does not report any of these concerns.  Additionally, he has new edema, erythema, and pain of his left great toe involving the foot concerning for gout versus infection.  His son reports a personal history of urinary retention associated with hospitalization with sepsis approximately 6 years ago.  He states he has been diagnosed with BPH.  Patient underwent CT stone study on 10/28/2020.  Prostate measures approximately 5.5 x 3.3 x 3.6 cm, calculated volume 42 g.  Foley catheter removed in the morning, see separate procedure note for details.  Patient returned to clinic in the afternoon for repeat PVR.  He reports drinking approximately 30oz of fluid.  He has been able to void and denies abdominal pain.  PVR 846 mL.  PMH: Past Medical History:  Diagnosis Date  . Cancer (HCC)    lip cancer  . Complete heart block ([place])   . Diabetes mellitus without complication ([place])   . GERD (gastroesophageal reflux disease)   . Hard of hearing    severe  . Hyperlipidemia   . Hypertension   . PVD (peripheral vascular disease) [facility])     Surgical History: Past Surgical History:  Procedure Laterality Date  . COLONOSCOPY    . COLONOSCOPY WITH PROPOFOL N/A 02/12/2018   Procedure: COLONOSCOPY WITH PROPOFOL;  Surgeon: Lollie Sails, MD;  Location: [facility]COPY;  Service: Endoscopy;  Laterality:  N/A;  . insert/replace/pacemaker    . PACEMAKER INSERTION N/A 08/07/2019   Procedure: PACEMAKER CHANGE OUT;  Surgeon: Isaias Cowman, MD;  Location: [facility] ORS;  Service: Cardiovascular;  Laterality: N/A;  . SURGERY OF LIP      Home Medications:  Allergies as of 11/11/2020      Reactions   Ezetimibe Other (See Comments)   unknown   Niacin Other (See Comments)   Unknown   Niacin-lovastatin Er    Unknown   Simvastatin    Unknown      Medication List       Accurate as of Nov 11, 2020  4:22 PM. If you have any questions, ask your nurse or doctor.        amLODipine 5 MG tablet Commonly known as: NORVASC Take 5 mg by mouth daily.   aspirin EC 81 MG tablet Take 81 mg by mouth daily.   CENTRUM PO Take 1 tablet by mouth daily.   glipiZIDE 2.5 MG 24 hr tablet Commonly known as: GLUCOTROL XL Take 1 tablet by mouth daily.   loratadine 10 MG tablet Commonly known as: CLARITIN Take 10 mg by mouth daily.   losartan-hydrochlorothiazide 100-25 MG tablet Commonly known as: HYZAAR Take 1 tablet by mouth daily.   LUTEIN 20 PO Take 1 tablet by mouth daily.   memantine 10 MG tablet Commonly known as: NAMENDA Take 10 mg by mouth 2 (two) times daily.   metFORMIN 1000 MG tablet Commonly known as: GLUCOPHAGE  Take 1,000 mg by mouth 2 (two) times daily.   rosuvastatin 20 MG tablet Commonly known as: CRESTOR Take 20 mg by mouth daily.   tamsulosin 0.4 MG Caps capsule Commonly known as: FLOMAX Take 1 capsule (0.4 mg total) by mouth daily.       Allergies:  Allergies  Allergen Reactions  . Ezetimibe Other (See Comments)    unknown  . Niacin Other (See Comments)    Unknown  . Niacin-Lovastatin Er     Unknown  . Simvastatin     Unknown    Family History: Family History  Problem Relation Age of Onset  . Stroke Sister   . Lung disease Sister        s/p of transplantation    Social History:   reports that he quit smoking about 31 years ago. His smoking use  included cigarettes. He quit after 30.00 years of use. He has never used smokeless tobacco. He reports previous alcohol use. He reports previous drug use.  Physical Exam: There were no vitals taken for this visit.  Constitutional:  Alert and oriented, no acute distress, nontoxic appearing HEENT: [place], AT Cardiovascular: No clubbing, cyanosis, or edema Respiratory: Normal respiratory effort, no increased work of breathing Skin: No rashes, bruises or suspicious lesions Neurologic: Grossly intact, no focal deficits, moving all 4 extremities Psychiatric: Normal mood and affect  Laboratory Data: Results for orders placed or performed in visit on 11/11/20  BLADDER SCAN AMB NON-IMAGING  Result Value Ref Range   Scan Result 823mL    Assessment & Plan:   1. Acute urinary retention Voiding trial failed.  Offered patient Foley catheter replacement versus CIC teaching and they elected for the former, see separate procedure note for details.  Patient has now failed 2 outpatient voiding trials.  Unclear how well he was voiding at baseline.  I explained that his bladder management options at this point include chronic indwelling Foley catheter versus CIC versus bladder outlet consultation and possible urodynamics.  Patient's wife is extremely interested in pursuing a bladder outlet procedure, reporting frustration with Foley catheterization.  We will schedule cystoscopy TRUS with Dr. Erlene Quan. - BLADDER SCAN AMB NON-IMAGING  2. Pain and swelling of toe of left foot Encouraged him to follow-up with Dr. Baldemar Lenis. They expressed understanding.  Return in about 2 weeks (around 11/25/2020) for Cysto and TRUS (bladder outlet consultation).  Debroah Loop, PA-C  [facility] [address] [place], [place] [address] [phone]

## 2020-11-24 ENCOUNTER — Other Ambulatory Visit: Payer: Self-pay

## 2020-11-24 ENCOUNTER — Encounter: Payer: Self-pay | Admitting: Urology

## 2020-11-24 ENCOUNTER — Ambulatory Visit (INDEPENDENT_AMBULATORY_CARE_PROVIDER_SITE_OTHER): Payer: Medicare PPO | Admitting: Urology

## 2020-11-24 VITALS — BP 122/67 | HR 83 | Ht 66.0 in | Wt 158.0 lb

## 2020-11-24 DIAGNOSIS — R338 Other retention of urine: Secondary | ICD-10-CM

## 2020-11-24 MED ORDER — CEPHALEXIN 250 MG PO CAPS
500.0000 mg | ORAL_CAPSULE | Freq: Once | ORAL | Status: AC
  Administered 2020-11-24: 500 mg via ORAL

## 2020-11-24 NOTE — Progress Notes (Signed)
11/24/20  Chief Complaint  Patient presents with  . Cysto    TRUS     HPI: 80-year-old male with refractory urinary symptoms related to BPH who presents today to the office for cystoscopy and prostate sizing.   Please see previous notes for details.    He is undergone multiple unsuccessful voiding trials.  He currently has a Foley catheter in place.  He is on Flomax.  Rectal exam has not been performed.  His most recent PSA was 0.71 in 03/2020.  He does have a personal history of urinary retention about 6 years ago.   Blood pressure 122/67, pulse 83, height 5' 6" (1.676 m), weight 158 lb (71.7 kg). NED. A&Ox3.   No respiratory distress   Abd soft, NT, ND Normal phallus with bilateral descended testicles    Cystoscopy Procedure Note  Patient identification was confirmed, informed consent was obtained, and patient was prepped using Betadine solution.  Lidocaine jelly was administered per urethral meatus.    Preoperative abx where received prior to procedure.     Pre-Procedure: - Inspection reveals a normal caliber ureteral meatus.  Procedure: The flexible cystoscope was introduced without difficulty - No urethral strictures/lesions are present. - Enlarged prostate bilobar coaptation - Normal bladder neck - Bilateral ureteral orifices identified - Bladder mucosa  reveals no ulcers, tumors, or lesions; mild catheter cystitis -Very large bladder capacity noted - No bladder stones - No trabeculation  Retroflexion shows unremarkable   Post-Procedure: - Patient tolerated the procedure well   Prostate transrectal ultrasound sizing   Informed consent was obtained after discussing risks/benefits of the procedure.  A time out was performed to ensure correct patient identity.   Pre-Procedure: -Transrectal probe was placed without difficulty -Transrectal Ultrasound performed revealing a 31.96 gm prostate measuring 4.81 x 2.69 x 4.71 cm (length) -No significant  hypoechoic or median lobe noted   Foley catheter was replaced at the end of the procedure     Assessment/ Plan:  1. Acute urinary retention Status post multiple failed voiding trials  Cystoscopy today revealed a large capacity bladder with a relatively small but tight prostate  We did lengthy discussion today both with the patient and his wife regarding management options.  In this situation, urodynamics could be helpful to assess whether or not this is related to neurogenic bladder or has a component of outlet obstruction.  Alternatively, we could proceed with outlet procedure presuming that this is at least a contributing factor to his underlying ongoing urinary retention and then reassess.  Because of his medical comorbidities, they are anxious about a more invasive procedure.  They would consider something such as UroLift.  We discussed the procedure at length including the risks of bleeding, infection, clip malfunction, migration, calcification, failure of the procedure amongst others.  All questions were answered.  Plan for a voiding trial 1 week postop.  We did also go ahead and discuss alternative bladder management options should he fail to void spontaneously. - CULTURE, URINE COMPREHENSIVE - cephALEXin (KEFLEX) capsule 500 mg   Filicia Scogin, MD     

## 2020-11-24 NOTE — Progress Notes (Signed)
Catheter Removal  Patient is present today for a catheter removal.  102ml of water was drained from the balloon. A 16FR foley cath was removed from the bladder no complications were noted . Patient tolerated well.  Performed by: Fonnie Jarvis, CMA  Simple Catheter Placement  Due to urinary retention patient is present today for a foley cath placement.  Patient was cleaned and prepped in a sterile fashion with betadine. A 16 coude FR foley catheter was inserted, urine return was noted  19ml, urine was clear in color.  The balloon was filled with 10cc of sterile water.  A leg bag was attached for drainage. Patient was also given a night bag to take home and was given instruction on how to change from one bag to another.  Patient was given instruction on proper catheter care.  Patient tolerated well, no complications were noted   Performed by: Fonnie Jarvis, CMA  Additional notes/ Follow up: Urine was collected for pre-op culture per Dr. Erlene Quan

## 2020-11-24 NOTE — H&P (View-Only) (Signed)
11/24/20  Chief Complaint  Patient presents with  . Cysto    TRUS     HPI: 80 year old male with refractory urinary symptoms related to BPH who presents today to the office for cystoscopy and prostate sizing.   Please see previous notes for details.    He is undergone multiple unsuccessful voiding trials.  He currently has a Foley catheter in place.  He is on Flomax.  Rectal exam has not been performed.  His most recent PSA was 0.71 in 03/2020.  He does have a personal history of urinary retention about 6 years ago.   Blood pressure 122/67, pulse 83, height 5\' 6"  (1.676 m), weight 158 lb (71.7 kg). NED. A&Ox3.   No respiratory distress   Abd soft, NT, ND Normal phallus with bilateral descended testicles    Cystoscopy Procedure Note  Patient identification was confirmed, informed consent was obtained, and patient was prepped using Betadine solution.  Lidocaine jelly was administered per urethral meatus.    Preoperative abx where received prior to procedure.     Pre-Procedure: - Inspection reveals a normal caliber ureteral meatus.  Procedure: The flexible cystoscope was introduced without difficulty - No urethral strictures/lesions are present. - Enlarged prostate bilobar coaptation - Normal bladder neck - Bilateral ureteral orifices identified - Bladder mucosa  reveals no ulcers, tumors, or lesions; mild catheter cystitis -Very large bladder capacity noted - No bladder stones - No trabeculation  Retroflexion shows unremarkable   Post-Procedure: - Patient tolerated the procedure well   Prostate transrectal ultrasound sizing   Informed consent was obtained after discussing risks/benefits of the procedure.  A time out was performed to ensure correct patient identity.   Pre-Procedure: -Transrectal probe was placed without difficulty -Transrectal Ultrasound performed revealing a 31.96 gm prostate measuring 4.81 x 2.69 x 4.71 cm (length) -No significant  hypoechoic or median lobe noted   Foley catheter was replaced at the end of the procedure     Assessment/ Plan:  1. Acute urinary retention Status post multiple failed voiding trials  Cystoscopy today revealed a large capacity bladder with a relatively small but tight prostate  We did lengthy discussion today both with the patient and his wife regarding management options.  In this situation, urodynamics could be helpful to assess whether or not this is related to neurogenic bladder or has a component of outlet obstruction.  Alternatively, we could proceed with outlet procedure presuming that this is at least a contributing factor to his underlying ongoing urinary retention and then reassess.  Because of his medical comorbidities, they are anxious about a more invasive procedure.  They would consider something such as UroLift.  We discussed the procedure at length including the risks of bleeding, infection, clip malfunction, migration, calcification, failure of the procedure amongst others.  All questions were answered.  Plan for a voiding trial 1 week postop.  We did also go ahead and discuss alternative bladder management options should he fail to void spontaneously. - CULTURE, URINE COMPREHENSIVE - cephALEXin (KEFLEX) capsule 500 mg   Hollice Espy, MD

## 2020-11-25 ENCOUNTER — Other Ambulatory Visit: Payer: Self-pay | Admitting: Urology

## 2020-11-25 DIAGNOSIS — N401 Enlarged prostate with lower urinary tract symptoms: Secondary | ICD-10-CM

## 2020-11-27 ENCOUNTER — Encounter
Admission: RE | Admit: 2020-11-27 | Discharge: 2020-11-27 | Disposition: A | Discharge status: Home / Self Care | Payer: Medicare PPO | Source: Ambulatory Visit | Attending: Urology | Admitting: Urology

## 2020-11-27 ENCOUNTER — Other Ambulatory Visit: Payer: Self-pay

## 2020-11-27 HISTORY — DX: Presence of cardiac pacemaker: Z95.0

## 2020-11-27 HISTORY — DX: Unspecified dementia, unspecified severity, without behavioral disturbance, psychotic disturbance, mood disturbance, and anxiety: F03.90

## 2020-11-27 NOTE — Patient Instructions (Signed)
Your procedure is scheduled on: 11/30/20 Report to [facility]. To find out your arrival time please call [phone] between 1PM - 3PM on 11/27/20 .  Remember: Instructions that are not followed completely may result in serious medical risk, up to and including death, or upon the discretion of your surgeon and anesthesiologist your surgery may need to be rescheduled.     _X__ 1. Do not eat food or drink after midnight the night before your procedure.                  __X__2.  On the morning of surgery brush your teeth with toothpaste and water, you                 may rinse your mouth with mouthwash if you wish.  Do not swallow any              toothpaste of mouthwash.     _X__ 3.  No Alcohol for 24 hours before or after surgery.   _X__ 4.  Do Not Smoke or use e-cigarettes For 24 Hours Prior to Your Surgery.                 Do not use any chewable tobacco products for at least 6 hours prior to                 surgery.  ____  5.  Bring all medications with you on the day of surgery if instructed.   __X__  6.  Notify your doctor if there is any change in your medical condition      (cold, fever, infections).     Do not wear jewelry, make-up, hairpins, clips or nail polish. Do not wear lotions, powders, or perfumes.  Do not shave 48 hours prior to surgery. Men may shave face and neck. Do not bring valuables to the hospital.    [facility] is not responsible for any belongings or valuables.  Contacts, dentures/partials or body piercings may not be worn into surgery. Bring a case for your contacts, glasses or hearing aids, a denture cup will be supplied. Leave your suitcase in the car. After surgery it may be brought to your room. For patients admitted to the hospital, discharge time is determined by your treatment team.   Patients discharged the day of surgery will not be allowed to drive home.   Please read over the following  fact sheets that you were given:     __X__ Take these medicines the morning of surgery with A SIP OF WATER:    1. amLODipine (NORVASC) 5 MG tablet  2. memantine (NAMENDA) 10 MG tablet  3. rosuvastatin (CRESTOR) 20 MG tablet  4.  5.  6.  ____ Fleet Enema (as directed)   __X__ Use CHG Soap/SAGE wipes as directed  ____ Use inhalers on the day of surgery  __X__ Stop metformin/Janumet/Farxiga 2 days prior to surgery    ____ Take 1/2 of usual insulin dose the night before surgery. No insulin the morning          of surgery.   ____ Stop Blood Thinners Coumadin/Plavix/Xarelto/Pleta/Pradaxa/Eliquis/Effient/Aspirin  on   Or contact your Surgeon, Cardiologist or Medical Doctor regarding  ability to stop your blood thinners  __X__ Stop Anti-inflammatories 7 days before surgery such as Advil, Ibuprofen, Motrin,  BC or Goodies Powder, Naprosyn, Naproxen, Aleve, Aspirin    __X__ Stop all herbal supplements, fish oil  or vitamin E until after surgery.    ____ Bring C-Pap to the hospital.      INSTRUCTIONS GIVEN VIA TELEPHONE INTERVIEW WITH PATIENT'S WIFE. UNDERSTANDING VERBALIZED. WIFE INSTRUCTED TO CALL FOR ANY FURTHER QUESTIONS.

## 2020-11-28 LAB — CULTURE, URINE COMPREHENSIVE

## 2020-11-30 ENCOUNTER — Ambulatory Visit
Admission: RE | Admit: 2020-11-30 | Discharge: 2020-11-30 | Disposition: A | Discharge status: Home / Self Care | Payer: Medicare PPO | Attending: Urology | Admitting: Urology

## 2020-11-30 ENCOUNTER — Encounter: Payer: Self-pay | Admitting: Urology

## 2020-11-30 ENCOUNTER — Ambulatory Visit: Payer: Medicare PPO

## 2020-11-30 ENCOUNTER — Encounter
Admission: RE | Disposition: A | Discharge status: Home / Self Care | Payer: Self-pay | Source: Home / Self Care | Attending: Urology

## 2020-11-30 DIAGNOSIS — I110 Hypertensive heart disease with heart failure: Secondary | ICD-10-CM | POA: Diagnosis not present

## 2020-11-30 DIAGNOSIS — R338 Other retention of urine: Secondary | ICD-10-CM | POA: Diagnosis not present

## 2020-11-30 DIAGNOSIS — Z7984 Long term (current) use of oral hypoglycemic drugs: Secondary | ICD-10-CM | POA: Insufficient documentation

## 2020-11-30 DIAGNOSIS — Z87891 Personal history of nicotine dependence: Secondary | ICD-10-CM | POA: Insufficient documentation

## 2020-11-30 DIAGNOSIS — K219 Gastro-esophageal reflux disease without esophagitis: Secondary | ICD-10-CM | POA: Insufficient documentation

## 2020-11-30 DIAGNOSIS — N401 Enlarged prostate with lower urinary tract symptoms: Secondary | ICD-10-CM | POA: Diagnosis present

## 2020-11-30 DIAGNOSIS — I5020 Unspecified systolic (congestive) heart failure: Secondary | ICD-10-CM | POA: Diagnosis not present

## 2020-11-30 DIAGNOSIS — E119 Type 2 diabetes mellitus without complications: Secondary | ICD-10-CM | POA: Diagnosis not present

## 2020-11-30 HISTORY — PX: CYSTOSCOPY WITH INSERTION OF UROLIFT: SHX6678

## 2020-11-30 LAB — GLUCOSE, CAPILLARY
Glucose-Capillary: 184 mg/dL — ABNORMAL HIGH (ref 70–99)
Glucose-Capillary: 196 mg/dL — ABNORMAL HIGH (ref 70–99)

## 2020-11-30 SURGERY — CYSTOSCOPY WITH INSERTION OF UROLIFT
Anesthesia: General

## 2020-11-30 MED ORDER — CHLORHEXIDINE GLUCONATE 0.12 % MT SOLN
OROMUCOSAL | Status: AC
  Filled 2020-11-30: qty 15

## 2020-11-30 MED ORDER — FENTANYL CITRATE (PF) 100 MCG/2ML IJ SOLN: 25.0000 ug | INTRAMUSCULAR | Status: DC | PRN

## 2020-11-30 MED ORDER — PROPOFOL 10 MG/ML IV BOLUS
INTRAVENOUS | Status: DC | PRN
  Administered 2020-11-30: 20 mg via INTRAVENOUS
  Administered 2020-11-30: 50 mg via INTRAVENOUS
  Administered 2020-11-30: 30 mg via INTRAVENOUS

## 2020-11-30 MED ORDER — CHLORHEXIDINE GLUCONATE 0.12 % MT SOLN: 15.0000 mL | Freq: Once | OROMUCOSAL | Status: DC

## 2020-11-30 MED ORDER — CEFAZOLIN SODIUM-DEXTROSE 2-4 GM/100ML-% IV SOLN
INTRAVENOUS | Status: AC
  Filled 2020-11-30: qty 100

## 2020-11-30 MED ORDER — NALOXONE HCL 0.4 MG/ML IJ SOLN
INTRAMUSCULAR | Status: AC
  Filled 2020-11-30: qty 1

## 2020-11-30 MED ORDER — TRAMADOL HCL 50 MG PO TABS: 50.0000 mg | ORAL_TABLET | Freq: Four times a day (QID) | ORAL | Status: DC | PRN

## 2020-11-30 MED ORDER — FENTANYL CITRATE (PF) 100 MCG/2ML IJ SOLN
INTRAMUSCULAR | Status: AC
  Filled 2020-11-30: qty 2

## 2020-11-30 MED ORDER — DEXAMETHASONE SODIUM PHOSPHATE 10 MG/ML IJ SOLN
INTRAMUSCULAR | Status: AC
  Filled 2020-11-30: qty 1

## 2020-11-30 MED ORDER — LIDOCAINE HCL (PF) 2 % IJ SOLN
INTRAMUSCULAR | Status: AC
  Filled 2020-11-30: qty 5

## 2020-11-30 MED ORDER — FLUMAZENIL 0.5 MG/5ML IV SOLN
INTRAVENOUS | Status: AC
  Filled 2020-11-30: qty 5

## 2020-11-30 MED ORDER — FAMOTIDINE 20 MG PO TABS
ORAL_TABLET | ORAL | Status: AC
  Administered 2020-11-30: 20 mg via ORAL
  Filled 2020-11-30: qty 1

## 2020-11-30 MED ORDER — FAMOTIDINE 20 MG PO TABS: 20.0000 mg | ORAL_TABLET | Freq: Once | ORAL | Status: AC

## 2020-11-30 MED ORDER — LIDOCAINE HCL URETHRAL/MUCOSAL 2 % EX GEL
CUTANEOUS | Status: AC
  Filled 2020-11-30: qty 5

## 2020-11-30 MED ORDER — PROPOFOL 10 MG/ML IV BOLUS
INTRAVENOUS | Status: AC
  Filled 2020-11-30: qty 20

## 2020-11-30 MED ORDER — LIDOCAINE HCL (CARDIAC) PF 100 MG/5ML IV SOSY
PREFILLED_SYRINGE | INTRAVENOUS | Status: DC | PRN
  Administered 2020-11-30: 30 mg via INTRAVENOUS

## 2020-11-30 MED ORDER — ONDANSETRON HCL 4 MG/2ML IJ SOLN: 4.0000 mg | Freq: Once | INTRAMUSCULAR | Status: DC | PRN

## 2020-11-30 MED ORDER — ONDANSETRON HCL 4 MG/2ML IJ SOLN
INTRAMUSCULAR | Status: AC
  Filled 2020-11-30: qty 2

## 2020-11-30 MED ORDER — CIPROFLOXACIN HCL 500 MG PO TABS
500.0000 mg | ORAL_TABLET | Freq: Two times a day (BID) | ORAL | 0 refills | Status: DC
Start: 2020-11-30 — End: 2021-09-21

## 2020-11-30 MED ORDER — SODIUM CHLORIDE 0.9 % IV SOLN: INTRAVENOUS | Status: DC

## 2020-11-30 MED ORDER — TRAMADOL HCL 50 MG PO TABS
ORAL_TABLET | ORAL | Status: AC
  Administered 2020-11-30: 50 mg via ORAL
  Filled 2020-11-30: qty 1

## 2020-11-30 MED ORDER — ORAL CARE MOUTH RINSE: 15.0000 mL | Freq: Once | OROMUCOSAL | Status: DC

## 2020-11-30 MED ORDER — CEFAZOLIN SODIUM-DEXTROSE 2-4 GM/100ML-% IV SOLN
2.0000 g | INTRAVENOUS | Status: AC
  Administered 2020-11-30: 2 g via INTRAVENOUS

## 2020-11-30 MED ORDER — TRAMADOL HCL 50 MG PO TABS: 50.0000 mg | ORAL_TABLET | Freq: Four times a day (QID) | ORAL | 0 refills | Status: DC | PRN

## 2020-11-30 SURGICAL SUPPLY — 13 items
BAG DRAIN CYSTO-URO LG1000N (MISCELLANEOUS) ×2 IMPLANT
CATH FOL 2WAY LX 18X30 (CATHETERS) ×2 IMPLANT
GLOVE SURG ENC MOIS LTX SZ6.5 (GLOVE) ×2 IMPLANT
GOWN STRL REUS W/ TWL LRG LVL3 (GOWN DISPOSABLE) ×2 IMPLANT
GOWN STRL REUS W/TWL LRG LVL3 (GOWN DISPOSABLE) ×4
KIT TURNOVER CYSTO (KITS) ×2 IMPLANT
MANIFOLD NEPTUNE II (INSTRUMENTS) ×2 IMPLANT
PACK CYSTO AR (MISCELLANEOUS) ×2 IMPLANT
SET CYSTO W/LG BORE CLAMP LF (SET/KITS/TRAYS/PACK) ×2 IMPLANT
SURGILUBE 2OZ TUBE FLIPTOP (MISCELLANEOUS) IMPLANT
SYSTEM UROLIFT (Male Continence) ×6 IMPLANT
WATER STERILE IRR 1000ML POUR (IV SOLUTION) ×2 IMPLANT
WATER STERILE IRR 3000ML UROMA (IV SOLUTION) ×2 IMPLANT

## 2020-11-30 NOTE — Anesthesia Postprocedure Evaluation (Signed)
Anesthesia Post Note  Patient: Cole Jordan Spallone Sr.  Procedure(s) Performed: CYSTOSCOPY WITH INSERTION OF UROLIFT (N/A )  Patient location during evaluation: PACU Anesthesia Type: General Level of consciousness: awake and alert, awake and oriented Pain management: pain level controlled Vital Signs Assessment: post-procedure vital signs reviewed and stable Respiratory status: spontaneous breathing, nonlabored ventilation and respiratory function stable Cardiovascular status: blood pressure returned to baseline and stable Postop Assessment: no apparent nausea or vomiting Anesthetic complications: no   No complications documented.   Last Vitals:  Vitals:   11/30/20 1030 11/30/20 1045  BP: 107/79 (!) 153/72  Pulse: 73 70  Resp: (!) 21 20  Temp:  (!) 36.3 C  SpO2: 100% 100%    Last Pain:  Vitals:   11/30/20 1045  TempSrc:   PainSc: 4                  Phill Mutter

## 2020-11-30 NOTE — Transfer of Care (Signed)
Immediate Anesthesia Transfer of Care Note  Patient: Cole Jordan Boven Sr.  Procedure(s) Performed: CYSTOSCOPY WITH INSERTION OF UROLIFT (N/A )  Patient Location: PACU  Anesthesia Type:General  Level of Consciousness: awake  Airway & Oxygen Therapy: Patient Spontanous Breathing and Patient connected to face mask oxygen  Post-op Assessment: Report given to RN and Post -op Vital signs reviewed and stable  Post vital signs: Reviewed  Last Vitals:  Vitals Value Taken Time  BP 113/77 11/30/20 0955  Temp    Pulse 69 11/30/20 0956  Resp 18 11/30/20 0956  SpO2 93 % 11/30/20 0956  Vitals shown include unvalidated device data.  Last Pain:  Vitals:   11/30/20 0707  TempSrc: Oral  PainSc: 0-No pain         Complications: No complications documented.

## 2020-11-30 NOTE — Interval H&P Note (Signed)
History and Physical Interval Note:  11/30/2020 8:22 AM  Cole Jordan Eckart Sr.  has presented today for surgery, with the diagnosis of BPH WITH URINARY RETENTION.  The various methods of treatment have been discussed with the patient and family. After consideration of risks, benefits and other options for treatment, the patient has consented to  Procedure(s): CYSTOSCOPY WITH INSERTION OF UROLIFT (N/A) as a surgical intervention.  The patient's history has been reviewed, patient examined, no change in status, stable for surgery.  I have reviewed the patient's chart and labs.  Questions were answered to the patient's satisfaction.    RRR cTAB  Hollice Espy

## 2020-11-30 NOTE — Anesthesia Procedure Notes (Signed)
Procedure Name: MAC Date/Time: 11/30/2020 9:33 AM Performed by: Rolla Plate, CRNA Pre-anesthesia Checklist: Patient identified, Emergency Drugs available, Suction available, Patient being monitored and Timeout performed Oxygen Delivery Method: Simple face mask

## 2020-11-30 NOTE — Discharge Instructions (Signed)
Urolift Post-Operative Instructions     Patient Expectations   1. Mild blood in your urine for about 1 week.  2. Urinary buring, frequency, and urgency for 10 days.  3. Mild pelvic pain 1-2 weeks.     Return to Activity     1. Drink water post procedure.  2. Take meds as needed.  Tylenol and/or Motrin is most helpful.  You may also by Pyridium/Azo over-the-counter for urinary burning.  3. No lifting or straining 48hrs.  4. Other activity when they feel up to it.   AMBULATORY SURGERY  DISCHARGE INSTRUCTIONS   1) The drugs that you were given will stay in your system until tomorrow so for the next 24 hours you should not:  A) Drive an automobile B) Make any legal decisions C) Drink any alcoholic beverage   2) You may resume regular meals tomorrow.  Today it is better to start with liquids and gradually work up to solid foods.  You may eat anything you prefer, but it is better to start with liquids, then soup and crackers, and gradually work up to solid foods.   3) Please notify your doctor immediately if you have any unusual bleeding, trouble breathing, redness and pain at the surgery site, drainage, fever, or pain not relieved by medication.    4) Additional Instructions:        Please contact your physician with any problems or Same Day Surgery at [phone], Monday through Friday 6 am to 4 pm, or [place] [facility] [place] Main number at [phone]. 

## 2020-11-30 NOTE — Op Note (Signed)
Preoperative diagnosis: BPH with urinary retention   Postoperative diagnosis: BPH with urinary retention   Principal procedure: Urolift procedure, with the placement of 3 implants.   Surgeon: Hollice Espy   Anesthesia:  MAC   Complications: None   Drains:  16 French Foley catheter   Estimated blood loss: < 5 mL   Indications: 63 year oldyear-old male with urinary retention is failed multiple voiding trials who is elected to undergo UroLift procedure.  Notably, he does have a very large capacity bladder and may have a component of neurogenic bladder.  After lengthy discussion with the patient and his wife, they have chosen to undergo UroLift to relieve any obstruction if this is underlying issue with hopes that he may void independently.  We also went ahead and discussed bladder management options if he fails.  He has been instructed to the procedure as well as risks and complications which include but are not limited to infection, bleeding, and inadequate treatment with the Urolift procedure alone, anesthetic complications, among others.  He understands these and desires to proceed.   Findings: Using the 17 French cystoscope, urethra and bladder were inspected.  There were no urethral lesions.  Prostatic urethra was obstructed secondary to bilobar hypertrophy.  The bladder was inspected circumferentially.  This revealed normal findings.   Description of procedure: The patient was properly identified in the holding area.  He received preoperative IV antibiotics.  He was taken to the operating room where sedation was administered. He is placed in the dorsolithotomy position.  His previous Foley catheter was removed. Genitalia and perineum were prepped and draped.  Proper timeout was performed.   A 37F cystoscope was inserted into the bladder with findings as described above.  The 1st pair of implants were placed at the bladder neck ~1.5 cm from the bladder Neck.  Overall, the prostate was  relatively small and thus only 1 additional implant was deployed on the left apex which created a nice anterior channel.     A final cystoscopy was conducted first to inspect the location and state of each implant and second, to confirm the presence of a continuous anterior channel was present through the prostatic urethra with irrigation flow turned off.    3 implants were delivered in total.   A 16 French Foley catheter was then replaced with 15 cc in the balloon.   Following this, the scope was removed.  After anesthetic reversal he was transported to the PACU in stable condition.  He tolerated the procedure well.   Plan:  We will have in  return next Monday for voiding trial.  Given that he grew small-volume Pseudomonas which is likely colonization, we will go and treat him with Cipro in light of recent manipulation.

## 2020-11-30 NOTE — Anesthesia Preprocedure Evaluation (Addendum)
Anesthesia Evaluation  Patient identified by MRN, date of birth, ID band Patient awake    Reviewed: Allergy & Precautions, NPO status , Patient's Chart, lab work & pertinent test results  History of Anesthesia Complications Negative for: history of anesthetic complications  Airway Mallampati: II  TM Distance: >3 FB Neck ROM: Full    Dental  (+) Poor Dentition, Missing   Pulmonary neg sleep apnea, neg COPD, former smoker,    breath sounds clear to auscultation- rhonchi (-) wheezing      Cardiovascular hypertension, Pt. on medications + Peripheral Vascular Disease  (-) CAD, (-) Past MI, (-) Cardiac Stents and (-) CABG + pacemaker (placed for complete heart block)  Rhythm:Regular Rate:Normal - Systolic murmurs and - Diastolic murmurs Echo 12/10/81: MILD LV SYSTOLIC DYSFUNCTION   NORMAL RIGHT VENTRICULAR SYSTOLIC FUNCTION  NO VALVULAR STENOSIS  TRIVIAL AR, PR  MILD MR, TR  EF 50% WITH APICAL HYPOKINESIS POSSIBLY FROM PACER     Neuro/Psych neg Seizures PSYCHIATRIC DISORDERS Dementia negative neurological ROS     GI/Hepatic Neg liver ROS, GERD  ,  Endo/Other  diabetes, Oral Hypoglycemic Agents  Renal/GU negative Renal ROS     Musculoskeletal negative musculoskeletal ROS (+)   Abdominal (+) - obese,   Peds  Hematology negative hematology ROS (+)   Anesthesia Other Findings Past Medical History: No date: Cancer ([place])     Comment:  lip cancer No date: Complete heart block (HCC) No date: Dementia (HCC) No date: Diabetes mellitus without complication (HCC) No date: GERD (gastroesophageal reflux disease) No date: Hard of hearing     Comment:  severe No date: Hyperlipidemia No date: Hypertension No date: Presence of permanent cardiac pacemaker No date: PVD (peripheral vascular disease) (HCC)   Reproductive/Obstetrics                            Anesthesia Physical Anesthesia Plan  ASA:  III  Anesthesia Plan: General   Post-op Pain Management:    Induction: Intravenous  PONV Risk Score and Plan: 1 and Propofol infusion  Airway Management Planned: Natural Airway  Additional Equipment:   Intra-op Plan:   Post-operative Plan:   Informed Consent: I have reviewed the patients History and Physical, chart, labs and discussed the procedure including the risks, benefits and alternatives for the proposed anesthesia with the patient or authorized representative who has indicated his/her understanding and acceptance.     Dental advisory given  Plan Discussed with: CRNA and Anesthesiologist  Anesthesia Plan Comments:         Anesthesia Quick Evaluation

## 2020-12-01 ENCOUNTER — Encounter: Payer: Self-pay | Admitting: Urology

## 2020-12-08 ENCOUNTER — Encounter: Payer: Self-pay | Admitting: Physician Assistant

## 2020-12-08 ENCOUNTER — Other Ambulatory Visit: Payer: Self-pay

## 2020-12-08 ENCOUNTER — Ambulatory Visit: Payer: Self-pay | Admitting: Physician Assistant

## 2020-12-08 ENCOUNTER — Ambulatory Visit (INDEPENDENT_AMBULATORY_CARE_PROVIDER_SITE_OTHER): Payer: Medicare PPO | Admitting: Physician Assistant

## 2020-12-08 DIAGNOSIS — N401 Enlarged prostate with lower urinary tract symptoms: Secondary | ICD-10-CM | POA: Diagnosis not present

## 2020-12-08 DIAGNOSIS — R338 Other retention of urine: Secondary | ICD-10-CM

## 2020-12-08 LAB — BLADDER SCAN AMB NON-IMAGING: Scan Result: 744

## 2020-12-08 NOTE — Patient Instructions (Signed)
Congratulations on your recent Urolift procedure! As discussed in clinic today, some common postoperative findings include: 1. Burning or pain with urination 2. Lower abdominal/pelvic pain 3. Blood in the urine 4. Urinary leakage Typically, these are mild and resolve within 2-4 weeks of surgery. If you have any concerns, please contact our office.

## 2020-12-08 NOTE — Progress Notes (Signed)
Afternoon follow-up  Patient returned to clinic this afternoon for repeat PVR. He reports drinking approximately 32oz of fluid. He has been able to urinate. He has not had urinary leakage. PVR 773mL. Patient denies lower abdominal pain.  Results for orders placed or performed in visit on 12/08/20  BLADDER SCAN AMB NON-IMAGING  Result Value Ref Range   Scan Result 744    Voiding trial failed. Offered patient CIC teaching versus Foley replacement with repeat voiding trial and he elected for the latter, see below.  Follow up: Return in about 1 week (around 12/15/2020) for Voiding trial.   Simple Catheter Placement  Due to urinary retention patient is present today for a foley cath placement.  Patient was cleaned and prepped in a sterile fashion with betadine and 2% lidocaine jelly was instilled into the urethra. A 16 FR foley catheter was inserted, urine return was noted  554ml, urine was clear in color.  The balloon was filled with 10cc of sterile water.  A leg bag was attached for drainage.Patient tolerated well, no complications were noted   Performed by: Debroah Loop, PA-C

## 2020-12-08 NOTE — Progress Notes (Signed)
Catheter Removal  Patient is present today for a catheter removal.  70ml of water was drained from the balloon. A 18FR foley cath was removed from the bladder no complications were noted . Patient tolerated well.  Performed by: Debroah Loop, PA-C   Follow up/ Additional notes: Push fluids and RTC this afternoon for PVR.

## 2020-12-15 ENCOUNTER — Ambulatory Visit (INDEPENDENT_AMBULATORY_CARE_PROVIDER_SITE_OTHER): Payer: Medicare PPO | Admitting: Physician Assistant

## 2020-12-15 ENCOUNTER — Ambulatory Visit: Payer: Self-pay | Admitting: Physician Assistant

## 2020-12-15 ENCOUNTER — Other Ambulatory Visit: Payer: Self-pay

## 2020-12-15 DIAGNOSIS — R338 Other retention of urine: Secondary | ICD-10-CM | POA: Diagnosis not present

## 2020-12-15 DIAGNOSIS — N401 Enlarged prostate with lower urinary tract symptoms: Secondary | ICD-10-CM | POA: Diagnosis not present

## 2020-12-15 LAB — BLADDER SCAN AMB NON-IMAGING

## 2020-12-15 NOTE — Progress Notes (Signed)
Catheter Removal  Patient is present today for a catheter removal.  9ml of water was drained from the balloon. A 16FR foley cath was removed from the bladder no complications were noted . Patient tolerated well.  Performed by: Kealie Barrie, PA-C   Follow up/ Additional notes: Push fluids and RTC this afternoon for PVR. 

## 2020-12-15 NOTE — Progress Notes (Signed)
Afternoon follow-up  Patient returned to clinic this afternoon for repeat PVR. He reports drinking approximately 40oz of fluid. He has been able to urinate. PVR >964mL.  Results for orders placed or performed in visit on 12/15/20  BLADDER SCAN AMB NON-IMAGING  Result Value Ref Range   Scan Result >966mL     Voiding trial failed. At this point, I shared my concern that his urinary retention is not obstructive in nature.  We discussed bladder management options at this point including CIC, chronic indwelling Foley catheter, and suprapubic catheter.  Patient's wife is concerned with his dementia that he will not be able to tolerate CIC and prefers the idea of an indwelling urinary catheter.  She states that he has been reporting some penile irritation associated with his urethral Foley and thinks he would tolerate a suprapubic catheter better.  She wishes to proceed with suprapubic catheter placement, with Foley placement today as a stopgap.  I am in agreement with this plan.  We discussed that suprapubic catheters are placed by interventional radiology and create an abdominal entry into the bladder.  We discussed that he will likely have his catheter exchanged by IR for the first several times until he is progressively upsized to the size and type of catheter that we use in our clinic.  I explained that he will require monthly catheter exchanges indefinitely.  We also discussed the signs of urinary infection with an indwelling urinary catheter including fever, chills, nausea, vomiting, lower abdominal pain, and low back pain.  I explained that we do not worry about the appearance of the urine with an indwelling catheter.  She expressed understanding.  Foley catheter placed in clinic today, see procedure note below.  Simple Catheter Placement  Due to urinary retention patient is present today for a foley cath placement.  Patient was cleaned and prepped in a sterile fashion with betadine and 2%  lidocaine jelly was instilled into the urethra. A 16 FR foley catheter was inserted, urine return was noted  1361ml, urine was clear in color.  The balloon was filled with 10cc of sterile water.  A leg bag was attached for drainage. Patient was given instruction on proper catheter care.  Patient tolerated well, no complications were noted   Performed by: Debroah Loop, PA-C   Additional notes/ Follow up: SPT placement referral to [place], PA-C 12/15/20 5:25 PM  I spent 35 minutes on the day of the encounter to include pre-visit record review, face-to-face time with the patient, and post-visit ordering of tests.

## 2020-12-17 ENCOUNTER — Telehealth: Payer: Self-pay

## 2020-12-17 DIAGNOSIS — R338 Other retention of urine: Secondary | ICD-10-CM

## 2020-12-17 NOTE — Telephone Encounter (Signed)
Order placed and faxed, awaiting to hear from Scheduling on dates

## 2020-12-17 NOTE — Telephone Encounter (Addendum)
-----   Message from Debroah Loop, [place] sent at 12/15/2020  5:33 PM EDT ----- Regarding: SPT placement I placed the order please schedule placement thanks

## 2020-12-18 NOTE — Telephone Encounter (Signed)
Per Pamala Hurry in IR scheduling patient for [facility] 12/21/20 @ 10:30am  Arrive @ 9:30am Patient's wife notified and instructions given NPO from midnight on

## 2020-12-21 ENCOUNTER — Ambulatory Visit
Admission: RE | Admit: 2020-12-21 | Discharge: 2020-12-21 | Disposition: A | Discharge status: Home / Self Care | Payer: Medicare PPO | Source: Ambulatory Visit | Attending: Physician Assistant | Admitting: Physician Assistant

## 2020-12-21 ENCOUNTER — Other Ambulatory Visit: Payer: Self-pay | Admitting: Radiology

## 2020-12-21 ENCOUNTER — Other Ambulatory Visit: Payer: Self-pay

## 2020-12-21 DIAGNOSIS — E1151 Type 2 diabetes mellitus with diabetic peripheral angiopathy without gangrene: Secondary | ICD-10-CM | POA: Diagnosis not present

## 2020-12-21 DIAGNOSIS — R338 Other retention of urine: Secondary | ICD-10-CM | POA: Diagnosis present

## 2020-12-21 LAB — CBC
HCT: 39.8 % (ref 39.0–52.0)
Hemoglobin: 13.4 g/dL (ref 13.0–17.0)
MCH: 28.8 pg (ref 26.0–34.0)
MCHC: 33.7 g/dL (ref 30.0–36.0)
MCV: 85.4 fL (ref 80.0–100.0)
Platelets: 202 10*3/uL (ref 150–400)
RBC: 4.66 MIL/uL (ref 4.22–5.81)
RDW: 14.3 % (ref 11.5–15.5)
WBC: 8 10*3/uL (ref 4.0–10.5)
nRBC: 0 % (ref 0.0–0.2)

## 2020-12-21 LAB — PROTIME-INR
INR: 1 (ref 0.8–1.2)
Prothrombin Time: 13.1 seconds (ref 11.4–15.2)

## 2020-12-21 LAB — GLUCOSE, CAPILLARY: Glucose-Capillary: 145 mg/dL — ABNORMAL HIGH (ref 70–99)

## 2020-12-21 MED ORDER — FENTANYL CITRATE (PF) 100 MCG/2ML IJ SOLN
INTRAMUSCULAR | Status: AC
  Filled 2020-12-21: qty 2

## 2020-12-21 MED ORDER — FLUMAZENIL 0.5 MG/5ML IV SOLN
0.2000 mg | Freq: Once | INTRAVENOUS | Status: AC
  Administered 2020-12-21: 0.2 mg via INTRAVENOUS
  Filled 2020-12-21: qty 5

## 2020-12-21 MED ORDER — NALOXONE HCL 0.4 MG/ML IJ SOLN
0.4000 mg | INTRAMUSCULAR | Status: DC | PRN
  Administered 2020-12-21: 0.4 mg via INTRAVENOUS
  Filled 2020-12-21: qty 1

## 2020-12-21 MED ORDER — MIDAZOLAM HCL 2 MG/2ML IJ SOLN
INTRAMUSCULAR | Status: AC | PRN
  Administered 2020-12-21 (×2): 1 mg via INTRAVENOUS

## 2020-12-21 MED ORDER — SODIUM CHLORIDE 0.9 % IV SOLN: INTRAVENOUS | Status: DC

## 2020-12-21 MED ORDER — FENTANYL CITRATE (PF) 100 MCG/2ML IJ SOLN
INTRAMUSCULAR | Status: AC | PRN
  Administered 2020-12-21 (×2): 50 ug via INTRAVENOUS

## 2020-12-21 MED ORDER — MIDAZOLAM HCL 2 MG/2ML IJ SOLN
INTRAMUSCULAR | Status: AC
  Filled 2020-12-21: qty 2

## 2020-12-21 NOTE — H&P (Addendum)
Chief Complaint: Urinary retention. Request is for suprapubic catheter placement  Referring Physician(s): Vaillancourt,Samantha  Supervising Physician: Daryll Brod  Patient Status: [facility] - Out-pt  History of Present Illness: Cole ADEBAYO Sr. is a 80 y.o. male 80 y.o. male outpatient. History of Complete heart block. DM, GERD, HLD, HTN, PVD, DM, dementia, BPH and urinary retention. CT renal stone from 4.20.22 shows no urinary obstruction. The patient underwent a s urolift procedure with insertion of 3 plants on 5.23.22. Per note from [place] PA dated 6.7.22.  The patient continued to fail his voiding trial post procedure. At this time the patient has a foley catheter in place.  Team is requesting a suprapubic catheter placement due to urinary retention.  Currently without any significant complaints. Patient alert and laying in bed, calm and comfortable.  Wife at bedside denies any fevers, headache, chest pain, SOB, cough, abdominal pain, nausea, vomiting or bleeding. Return precautions and treatment recommendations and follow-up discussed with the patient's wife who is agreeable with the plan. Wife made aware that due to the patient's anatomy the procedure may be aborted.   Past Medical History:  Diagnosis Date   Cancer ([place])    lip cancer   Complete heart block (HCC)    Dementia (HCC)    Diabetes mellitus without complication (HCC)    GERD (gastroesophageal reflux disease)    Hard of hearing    severe   Hyperlipidemia    Hypertension    Presence of permanent cardiac pacemaker    PVD (peripheral vascular disease) ([place])     Past Surgical History:  Procedure Laterality Date   CARDIAC CATHETERIZATION     COLONOSCOPY     COLONOSCOPY WITH PROPOFOL N/A 02/12/2018   Procedure: COLONOSCOPY WITH PROPOFOL;  Surgeon: Lollie Sails, MD;  Location: [facility] ENDOSCOPY;  Service: Endoscopy;  Laterality: N/A;   CYSTOSCOPY WITH INSERTION OF UROLIFT N/A 11/30/2020   Procedure:  CYSTOSCOPY WITH INSERTION OF UROLIFT;  Surgeon: Hollice Espy, MD;  Location: [facility] ORS;  Service: Urology;  Laterality: N/A;   INSERT / REPLACE / REMOVE PACEMAKER     insert/replace/pacemaker     PACEMAKER INSERTION N/A 08/07/2019   Procedure: PACEMAKER CHANGE OUT;  Surgeon: Isaias Cowman, MD;  Location: [facility] ORS;  Service: Cardiovascular;  Laterality: N/A;   SURGERY OF LIP      Allergies: Ezetimibe, Niacin, Niacin-lovastatin er, and Simvastatin  Medications: Prior to Admission medications   Medication Sig Start Date End Date Taking? Authorizing Provider  amLODipine (NORVASC) 5 MG tablet Take 5 mg by mouth daily.   Yes [provider]  aspirin EC 81 MG tablet Take 81 mg by mouth daily.   Yes [provider]  glipiZIDE (GLUCOTROL XL) 2.5 MG 24 hr tablet Take 2.5 mg by mouth daily. 09/14/20  Yes [provider]  losartan-hydrochlorothiazide (HYZAAR) 100-25 MG tablet Take 1 tablet by mouth daily.   Yes [provider]  memantine (NAMENDA) 10 MG tablet Take 10 mg by mouth 2 (two) times daily. 10/02/20  Yes [provider]  metFORMIN (GLUCOPHAGE) 1000 MG tablet Take 1,000 mg by mouth 2 (two) times daily.    Yes [provider]  rosuvastatin (CRESTOR) 20 MG tablet Take 20 mg by mouth daily.   Yes [provider]  ciprofloxacin (CIPRO) 500 MG tablet Take 1 tablet (500 mg total) by mouth 2 (two) times daily. 11/30/20   Hollice Espy, MD  traMADol (ULTRAM) 50 MG tablet Take 1 tablet (50 mg total) by mouth  every 6 (six) hours as needed. Patient not taking: Reported on 12/21/2020 11/30/20 11/30/21  Hollice Espy, MD     Family History  Problem Relation Age of Onset   Stroke Sister    Lung disease Sister        s/p of transplantation    Social History   Socioeconomic History   Marital status: Married    Spouse name: Lorna Few   Number of children: 3   Years of education: Not on file   Highest education level: Not on file   Occupational History   Not on file  Tobacco Use   Smoking status: Former    Years: 30.00    Pack years: 0.00    Types: Cigarettes    Quit date: 07/11/1989    Years since quitting: 31.4   Smokeless tobacco: Never  Vaping Use   Vaping Use: Never used  Substance and Sexual Activity   Alcohol use: Not Currently   Drug use: Not Currently   Sexual activity: Not on file  Other Topics Concern   Not on file  Social History Narrative   Lives at home with spouse: pt. Very forgetful (dementia)    Social Determinants of Health   Financial Resource Strain: Not on file  Food Insecurity: Not on file  Transportation Needs: Not on file  Physical Activity: Not on file  Stress: Not on file  Social Connections: Not on file     Review of Systems: A 12 point ROS discussed and pertinent positives are indicated in the HPI above.  All other systems are negative.  Review of Systems  Unable to perform ROS: Dementia   Vital Signs: BP 118/68   Pulse 70   Temp 98.4 F (36.9 C) (Oral)   Resp 17   Ht 5\' 6"  (1.676 m)   Wt 169 lb (76.7 kg)   SpO2 96%   BMI 27.28 kg/m   Physical Exam Vitals and nursing note reviewed.  Constitutional:      Appearance: He is well-developed.  HENT:     Head: Normocephalic.  Cardiovascular:     Rate and Rhythm: Normal rate and regular rhythm.     Heart sounds: Normal heart sounds.  Pulmonary:     Effort: Pulmonary effort is normal.     Breath sounds: Normal breath sounds.  Musculoskeletal:        General: Normal range of motion.     Cervical back: Normal range of motion.  Skin:    General: Skin is dry.  Neurological:     Mental Status: He is alert. He is disoriented.    Imaging: No results found.  Labs:  CBC: Recent Labs    10/28/20 1200 10/29/20 0437 12/21/20 0958  WBC 12.3* 11.0* 8.0  HGB 15.8 13.1 13.4  HCT 47.3 39.6 39.8  PLT 197 162 202    COAGS: Recent Labs    10/28/20 1200 12/21/20 0958  INR 1.1 1.0    BMP: Recent Labs     10/28/20 1200 10/29/20 0437  NA 138 141  K 3.5 3.5  CL 100 107  CO2 25 25  GLUCOSE 113* 112*  BUN 20 18  CALCIUM 9.7 8.9  CREATININE 1.11 1.10  GFRNONAA >60 >60    LIVER FUNCTION TESTS: Recent Labs    10/28/20 1200  BILITOT 1.7*  AST 19  ALT 16  ALKPHOS 54  PROT 8.1  ALBUMIN 4.7     Assessment and Plan:  80 y.o. male outpatient. History of Complete heart  block. DM GERD, HLD, HTN, PVD, DM, dementia, BPH and urinary retention. CT renal stone from 4.20.22 shows no urinary obstruction. The patient underwent a s urolift procedure with insertion of 3 plants on 5.23.22. Per note from [place] PA dated 6.7.22.  The patient continued to fail his voiding trial post procedure. At this time the patient has a foley catheter in place.  Team is requesting a suprapubic catheter placement due to urinary retention.   All labs and medications are within acceptable parameters. No pertinent allergies. NPO since midnight. Wife made aware that due to the patient's anatomy the procedure may be aborted.  Risks and benefits discussed with the patient 's wife including bleeding, infection, damage to adjacent structures, bowel perforation/fistula connection, and sepsis.  All of the patient's wife's questions were answered, patient is agreeable to proceed. Consent signed and in chart.   Thank you for this interesting consult.  I greatly enjoyed meeting Cole W Hyampom Sr. and look forward to participating in their care.  A copy of this report was sent to the requesting provider on this date.  Electronically Signed: Jacqualine Mau, NP 12/21/2020, 10:47 AM   I spent a total of  30 Minutes   in face to face in clinical consultation, greater than 50% of which was counseling/coordinating care for supra pubic catheter placement

## 2020-12-21 NOTE — Progress Notes (Signed)
Patient has remained clinically stable post procedure. Vitals have remained stable. Has been awake/alert and oriented since procedure. Wife at bedside with discharge instructions given. Dr Annamaria Boots made aware of patient's stability with ok given to discharge patient at this time.

## 2020-12-21 NOTE — Telephone Encounter (Signed)
Per radiology, able to get 16 FR SPT placed at today's procedure

## 2020-12-21 NOTE — Progress Notes (Signed)
Cole Kingdom, RN reviewed foley and suprapubic site care with pt. And his wife for 10 min. Wife verbalizes understanding of conversation. Pt. Nods to conversation.

## 2020-12-21 NOTE — Procedures (Signed)
Interventional Radiology Procedure Note  Procedure: Ct 16 fr sp tube    Complications: None  Estimated Blood Loss:  min  Findings: Confirmed in the bladder    M. Daryll Brod, MD

## 2020-12-22 NOTE — Telephone Encounter (Signed)
Called and spoke with patient's wife and scheduled patient for next exchange in office with Sam in 4wks.

## 2020-12-25 ENCOUNTER — Encounter: Payer: Self-pay | Admitting: Emergency Medicine

## 2020-12-25 ENCOUNTER — Telehealth: Payer: Self-pay | Admitting: *Deleted

## 2020-12-25 ENCOUNTER — Emergency Department
Admission: EM | Admit: 2020-12-25 | Discharge: 2020-12-25 | Disposition: A | Discharge status: Home / Self Care | Payer: Medicare PPO | Attending: Emergency Medicine | Admitting: Emergency Medicine

## 2020-12-25 DIAGNOSIS — Z7984 Long term (current) use of oral hypoglycemic drugs: Secondary | ICD-10-CM | POA: Diagnosis not present

## 2020-12-25 DIAGNOSIS — Z87891 Personal history of nicotine dependence: Secondary | ICD-10-CM | POA: Diagnosis not present

## 2020-12-25 DIAGNOSIS — Z85818 Personal history of malignant neoplasm of other sites of lip, oral cavity, and pharynx: Secondary | ICD-10-CM | POA: Diagnosis not present

## 2020-12-25 DIAGNOSIS — F0390 Unspecified dementia without behavioral disturbance: Secondary | ICD-10-CM | POA: Diagnosis not present

## 2020-12-25 DIAGNOSIS — I10 Essential (primary) hypertension: Secondary | ICD-10-CM | POA: Diagnosis not present

## 2020-12-25 DIAGNOSIS — Z950 Presence of cardiac pacemaker: Secondary | ICD-10-CM | POA: Insufficient documentation

## 2020-12-25 DIAGNOSIS — R310 Gross hematuria: Secondary | ICD-10-CM | POA: Diagnosis present

## 2020-12-25 DIAGNOSIS — Z7982 Long term (current) use of aspirin: Secondary | ICD-10-CM | POA: Diagnosis not present

## 2020-12-25 DIAGNOSIS — Z79899 Other long term (current) drug therapy: Secondary | ICD-10-CM | POA: Diagnosis not present

## 2020-12-25 DIAGNOSIS — E119 Type 2 diabetes mellitus without complications: Secondary | ICD-10-CM | POA: Diagnosis not present

## 2020-12-25 LAB — BASIC METABOLIC PANEL
Anion gap: 8 (ref 5–15)
BUN: 18 mg/dL (ref 8–23)
CO2: 24 mmol/L (ref 22–32)
Calcium: 9 mg/dL (ref 8.9–10.3)
Chloride: 108 mmol/L (ref 98–111)
Creatinine, Ser: 0.92 mg/dL (ref 0.61–1.24)
GFR, Estimated: >60 mL/min (ref >60)
Glucose, Bld: 100 mg/dL — ABNORMAL HIGH (ref 70–99)
Potassium: 3.5 mmol/L (ref 3.5–5.1)
Sodium: 140 mmol/L (ref 135–145)

## 2020-12-25 LAB — URINALYSIS, COMPLETE (UACMP) WITH MICROSCOPIC
Bacteria, UA: NONE SEEN
Bilirubin Urine: NEGATIVE
Glucose, UA: 150 mg/dL — AB
Ketones, ur: NEGATIVE mg/dL
Leukocytes,Ua: NEGATIVE
Nitrite: NEGATIVE
Protein, ur: 100 mg/dL — AB
RBC / HPF: >50 RBC/hpf — ABNORMAL HIGH (ref 0–5)
Specific Gravity, Urine: 1.014 (ref 1.005–1.030)
Squamous Epithelial / HPF: NONE SEEN (ref 0–5)
pH: 6 (ref 5.0–8.0)

## 2020-12-25 LAB — CBC
HCT: 39.6 % (ref 39.0–52.0)
Hemoglobin: 13.1 g/dL (ref 13.0–17.0)
MCH: 28.5 pg (ref 26.0–34.0)
MCHC: 33.1 g/dL (ref 30.0–36.0)
MCV: 86.1 fL (ref 80.0–100.0)
Platelets: 218 10*3/uL (ref 150–400)
RBC: 4.6 MIL/uL (ref 4.22–5.81)
RDW: 14.4 % (ref 11.5–15.5)
WBC: 6.9 10*3/uL (ref 4.0–10.5)
nRBC: 0 % (ref 0.0–0.2)

## 2020-12-25 NOTE — ED Triage Notes (Signed)
Pt with spouse in triage who reports pt had nephrostomy placed on 6/13 and since has had bleeding continued in the drainage bag. Urologist recommended to come to ED for evaluation. Blood seen in drainage bag on assessment.

## 2020-12-25 NOTE — Telephone Encounter (Signed)
Patient's wife called concerned with ongoing blood since SPT placed on 12/23/20. Bleeding has not improved, has not seen urine output. Patient has been drinking water and having bowel movements. Denies pain. Patient's wife took temperature- 96 degrees. Denies any other symptoms. Per Zara Council, PA advised to go to ER for evaluation. Wife, Celicliah advised and voiced understanding.

## 2020-12-25 NOTE — ED Provider Notes (Signed)
[facility] Emergency Department Provider Note   ____________________________________________    I have reviewed the triage vital signs and the nursing notes.   HISTORY  Chief Complaint Post-op Problem     HPI Cole Mccarty. is a 80 y.o. male with history of dementia, diabetes, and urinary retention, with BPH who presents with complaints of blood in his suprapubic catheter.  Patient just had suprapubic catheter placed on Monday, had had Foley catheter for 6 weeks.  Has had reddish urine since catheter was placed, urology had informed him that it should clear within a couple of days but it has not.  Today she called urologist office because of continued bleeding but also had checked his temperature and found to be 76 degrees.  Patient's temperature is normal here.  Suspect abnormality is 76 degrees would not be compatible with life.  Patient has no complaints, he states he feels fine.  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   Diabetes mellitus without complication (HCC)    GERD (gastroesophageal reflux disease)    Hyperlipidemia    Hypertension    Dementia ([place])    Fall    Acute metabolic encephalopathy    Acute urinary retention    Periodontal disease     Past Surgical History:  Procedure Laterality Date   CARDIAC CATHETERIZATION     COLONOSCOPY     COLONOSCOPY WITH PROPOFOL N/A 02/12/2018   Procedure: COLONOSCOPY WITH PROPOFOL;  Surgeon: Lollie Sails, MD;  Location: [facility] ENDOSCOPY;  Service:  Endoscopy;  Laterality: N/A;   CYSTOSCOPY WITH INSERTION OF UROLIFT N/A 11/30/2020   Procedure: CYSTOSCOPY WITH INSERTION OF UROLIFT;  Surgeon: Hollice Espy, MD;  Location: [facility] ORS;  Service: Urology;  Laterality: N/A;   INSERT / REPLACE / REMOVE PACEMAKER     insert/replace/pacemaker     PACEMAKER INSERTION N/A 08/07/2019   Procedure: PACEMAKER CHANGE OUT;  Surgeon: Isaias Cowman, MD;  Location: [facility] ORS;  Service: Cardiovascular;  Laterality: N/A;   SURGERY OF LIP      Prior to Admission medications   Medication Sig Start Date End Date Taking? Authorizing Provider  amLODipine (NORVASC) 5 MG tablet Take 5 mg by mouth daily.    [provider]  aspirin EC 81 MG tablet Take 81 mg by mouth daily.    [provider]  ciprofloxacin (CIPRO) 500 MG tablet Take 1 tablet (500 mg total) by mouth 2 (two) times daily. 11/30/20   Hollice Espy, MD  glipiZIDE (GLUCOTROL XL) 2.5 MG 24 hr tablet Take 2.5 mg by mouth daily. 09/14/20   [provider]  losartan-hydrochlorothiazide (HYZAAR) 100-25 MG tablet Take 1 tablet by mouth daily.    [provider]  memantine (NAMENDA) 10 MG tablet Take 10 mg by mouth 2 (two) times daily. 10/02/20   [provider]  metFORMIN (GLUCOPHAGE) 1000 MG tablet Take 1,000 mg by mouth 2 (two) times daily.  [provider]  rosuvastatin (CRESTOR) 20 MG tablet Take 20 mg by mouth daily.    [provider]  traMADol (ULTRAM) 50 MG tablet Take 1 tablet (50 mg total) by mouth every 6 (six) hours as needed. Patient not taking: Reported on 12/21/2020 11/30/20 11/30/21  Hollice Espy, MD     Allergies Ezetimibe, Niacin, Niacin-lovastatin er, and Simvastatin  Family History  Problem Relation Age of Onset   Stroke Sister    Lung disease Sister        s/p of transplantation    Social History Social History   Tobacco Use   Smoking status: Former    Years: 30.00    Pack years: 0.00    Types: Cigarettes     Quit date: 07/11/1989    Years since quitting: 31.4   Smokeless tobacco: Never  Vaping Use   Vaping Use: Never used  Substance Use Topics   Alcohol use: Not Currently   Drug use: Not Currently    Review of Systems  Constitutional: No fever/chills Eyes: No visual changes.  ENT: No sore throat. Cardiovascular: Denies chest pain. Respiratory: Denies shortness of breath. Gastrointestinal: No abdominal pain.  No nausea, no vomiting.   Genitourinary: As above Musculoskeletal: Negative for back pain. Skin: Negative for rash. Neurological: Negative for headaches or weakness   ____________________________________________   PHYSICAL EXAM:  VITAL SIGNS: ED Triage Vitals [12/25/20 1205]  Enc Vitals Group     BP 122/62     Pulse Rate 76     Resp 18     Temp 98.3 F (36.8 C)     Temp Source Oral     SpO2 97 %     Weight      Height      Head Circumference      Peak Flow      Pain Score      Pain Loc      Pain Edu?      Excl. in [place]?     Constitutional: Alert  No acute distress. Pleasant and interactive  Nose: No congestion/rhinnorhea. Mouth/Throat: Mucous membranes are moist.   Neck:  Painless ROM Cardiovascular: Normal rate, regular rhythm. Grossly normal heart sounds.  Good peripheral circulation. Respiratory: Normal respiratory effort.  No retractions. Lungs CTAB. Gastrointestinal: Soft and nontender. No distention.  No CVA tenderness Genitourinary: Suprapubic catheter in place, no tenderness Musculoskeletal: No lower extremity tenderness nor edema.  Warm and well perfused Neurologic:  Normal speech and language. No gross focal neurologic deficits are appreciated.  Skin:  Skin is warm, dry and intact. No rash noted. Psychiatric: Mood and affect are normal. Speech and behavior are normal.  ____________________________________________   LABS (all labs ordered are listed, but only abnormal results are displayed)  Labs Reviewed  BASIC METABOLIC PANEL - Abnormal;  Notable for the following components:      Result Value   Glucose, Bld 100 (*)    All other components within normal limits  URINALYSIS, COMPLETE (UACMP) WITH MICROSCOPIC - Abnormal; Notable for the following components:   Color, Urine AMBER (*)    APPearance HAZY (*)    Glucose, UA 150 (*)    Hgb urine dipstick LARGE (*)    Protein, ur 100 (*)    RBC / HPF >50 (*)    All other components within normal limits  URINE CULTURE  CBC   ____________________________________________  EKG  None ____________________________________________  RADIOLOGY  None ____________________________________________   PROCEDURES  Procedure(s) performed: No  Procedures  Critical Care performed: No ____________________________________________   INITIAL IMPRESSION / ASSESSMENT AND PLAN / ED COURSE  Pertinent labs & imaging results that were available during my care of the patient were reviewed by me and considered in my medical decision making (see chart for details).   Patient well-appearing in no acute distress.  Vital signs here are normal.  Exam is quite reassuring, no tenderness or distention.  Reddish urine in leg bag however no clots.  I suspect this is mild continued bleeding status post suprapubic catheter placement, the patient is on a baby aspirin.  We will check urinalysis, lab work including kidney function white blood cell count  Lab work is reassuring, no evidence of UTI.  No indication for admission, appropriate for discharge with outpatient follow-up with urology.    ____________________________________________   FINAL CLINICAL IMPRESSION(S) / ED DIAGNOSES  Final diagnoses:  Gross hematuria        Note:  This document was prepared using Dragon voice recognition software and may include unintentional dictation errors.    Lavonia Drafts, MD 12/25/20 1455

## 2020-12-25 NOTE — ED Notes (Signed)
See triage note. Pt with wife. Pt hx dementia. Wife reports pt has had foley cath for weeks d/t pt bladder not working. Pt had suprapubic catheter placed Monday and was told blood would subside within in 2 days. Pt still having blood noted in bag.  Pt in NAD

## 2020-12-28 LAB — URINE CULTURE: Culture: 40000 — AB

## 2021-01-12 ENCOUNTER — Ambulatory Visit (INDEPENDENT_AMBULATORY_CARE_PROVIDER_SITE_OTHER): Payer: Medicare PPO | Admitting: Physician Assistant

## 2021-01-12 ENCOUNTER — Other Ambulatory Visit: Payer: Self-pay

## 2021-01-12 DIAGNOSIS — Z435 Encounter for attention to cystostomy: Secondary | ICD-10-CM

## 2021-01-12 NOTE — Progress Notes (Signed)
Suprapubic Cath Change  Patient is present today for a suprapubic catheter change due to urinary retention.  The catheter tubing was transected to release the pigtail coil and sterile forceps and scissors were used to remove the securing stitch at the level of the skin. Site was cleaned and prepped in a sterile fashion with betadine.  A 16FR foley cath was replaced into the tract no complications were noted. Urine return was not noted and patient was counseled to RTC in 1 hour if no flow by then, 10 ml of sterile water was inflated into the balloon and a leg bag was attached for drainage.  Patient tolerated well. An additional leg bag was given to patient and proper instruction was given on how to switch bags.    Performed by: Debroah Loop, PA-C   Follow up: Return in about 4 weeks (around 02/09/2021) for SPT exchange.

## 2021-02-09 ENCOUNTER — Ambulatory Visit: Payer: Self-pay | Admitting: Physician Assistant

## 2021-02-11 ENCOUNTER — Ambulatory Visit: Payer: Medicare PPO | Admitting: Physician Assistant

## 2021-02-11 ENCOUNTER — Other Ambulatory Visit: Payer: Self-pay

## 2021-02-11 DIAGNOSIS — Z435 Encounter for attention to cystostomy: Secondary | ICD-10-CM | POA: Diagnosis not present

## 2021-02-11 DIAGNOSIS — N3000 Acute cystitis without hematuria: Secondary | ICD-10-CM | POA: Diagnosis not present

## 2021-02-11 MED ORDER — AMOXICILLIN-POT CLAVULANATE 875-125 MG PO TABS: 1.0000 | ORAL_TABLET | Freq: Two times a day (BID) | ORAL | 0 refills | Status: AC

## 2021-02-11 NOTE — Progress Notes (Signed)
Suprapubic Cath Change  Patient is present today for a suprapubic catheter change due to urinary retention.  18m of water was drained from the balloon, a 16FR foley cath was removed from the tract with out difficulty.  Site was cleaned and prepped in a sterile fashion with betadine.  A 16FR foley cath was replaced into the tract no complications were noted. Urine return was noted, 10 ml of sterile water was inflated into the balloon and a leg bag was attached for drainage.  Patient tolerated well.  Performed by: SDebroah Loop PA-C   Additional notes: Patient's wife reports he has complained of lower abdominal pain localized to his SPT insertion site since this morning. She has also noticed malodorous urine this month. Patient denies discomfort today, however on physical exam his SPT tract has some purulent drainage and is slightly erythematous. He is a poor historian 2/2 dementia; recommend treating with empiric Augmentin for possible UTI per most recent culture data. Urine sample obtained from his new catheter today for culture.  Follow up: Return in about 4 weeks (around 03/11/2021) for SPT exchange.    I spent 12 minutes on the day of the encounter to include pre-visit record review, face-to-face time with the patient, and post-visit ordering of tests.

## 2021-02-16 LAB — CULTURE, URINE COMPREHENSIVE

## 2021-03-11 ENCOUNTER — Ambulatory Visit: Payer: Medicare PPO | Admitting: Physician Assistant

## 2021-03-11 ENCOUNTER — Other Ambulatory Visit: Payer: Self-pay

## 2021-03-11 DIAGNOSIS — Z435 Encounter for attention to cystostomy: Secondary | ICD-10-CM

## 2021-03-11 NOTE — Progress Notes (Signed)
Suprapubic Cath Change  Patient is present today for a suprapubic catheter change due to urinary retention.  61m of water was drained from the balloon, a 16FR foley cath was removed from the tract without difficulty.  Site was cleaned and prepped in a sterile fashion with betadine.  A 16FR foley cath was replaced into the tract no complications were noted. Urine return was noted, 10 ml of sterile water was inflated into the balloon and a leg bag was attached for drainage.  Patient tolerated well.    Performed by: SDebroah Loop PA-C   Additional notes: Resolution of previously-reported lower abdominal pain as well as erythema and purulent drainage from SPT tract compared to prior.  Follow up: Return in about 4 weeks (around 04/08/2021) for SPT exchange.

## 2021-04-08 ENCOUNTER — Ambulatory Visit: Payer: Medicare PPO | Admitting: Physician Assistant

## 2021-04-14 ENCOUNTER — Telehealth: Payer: Self-pay

## 2021-04-14 NOTE — Telephone Encounter (Signed)
Spoke with patient's wife Meridee Score and scheduled an in-person Palliative Consult for 04/20/21 @ 12PM.   COVID screening was negative. No pets in home. Patient lives with wife.   Consent obtained; updated Outlook/Netsmart/Team List and Epic.   Family is aware they may be receiving a call from provider the day before or day of to confirm appointment.

## 2021-04-15 ENCOUNTER — Ambulatory Visit: Payer: Medicare PPO | Admitting: Physician Assistant

## 2021-04-15 ENCOUNTER — Other Ambulatory Visit: Payer: Self-pay

## 2021-04-15 DIAGNOSIS — Z435 Encounter for attention to cystostomy: Secondary | ICD-10-CM | POA: Diagnosis not present

## 2021-04-15 NOTE — Progress Notes (Signed)
Suprapubic Cath Change  Patient is present today for a suprapubic catheter change due to urinary retention.  47ml of water was drained from the balloon, a 16FR foley cath was removed from the tract without difficulty.  Site was cleaned and prepped in a sterile fashion with betadine.  A 16FR foley cath was replaced into the tract no complications were noted. Urine return was noted, 10 ml of sterile water was inflated into the balloon and a leg bag was attached for drainage.  Patient tolerated well.    Performed by: Debroah Loop, PA-C   Follow up: Return in about 4 weeks (around 05/13/2021) for SPT exchange.

## 2021-04-20 ENCOUNTER — Other Ambulatory Visit: Payer: Medicare PPO | Admitting: Student

## 2021-04-20 ENCOUNTER — Other Ambulatory Visit: Payer: Self-pay

## 2021-04-20 DIAGNOSIS — Z515 Encounter for palliative care: Secondary | ICD-10-CM

## 2021-04-20 DIAGNOSIS — F0283 Dementia in other diseases classified elsewhere, unspecified severity, with mood disturbance: Secondary | ICD-10-CM

## 2021-04-20 NOTE — Progress Notes (Signed)
Therapist, nutritional Palliative Care Consult Note Telephone: [phone]  Fax: [phone]   Date of encounter: 04/20/21 12:09 PM PATIENT NAME: Cole Mccarty [address] [place] [address]   [phone] (home)  DOB: [birth year] MRN: [id number] PRIMARY CARE PROVIDER:    Kandyce Rud, MD,  [address] [facility] - Family and Internal Medicine [place] [address] [phone]  REFERRING PROVIDER:   Kandyce Rud, MD [phone] [address] [facility] - Family and Internal Medicine [place],  [place] [address] [phone]  RESPONSIBLE PARTY:    Contact Information     Name Relation Home Work Mobile   Miroslav, Gin Spouse [phone]     Lory, Galan [phone]   Deveron, Shamoon [phone]  [phone]   Truddie Coco Daughter [phone]  [phone]   Christen, Bedoya [phone]  [phone]        I met face to face with patient and family in the home. Palliative Care was asked to follow this patient by consultation request of  Kandyce Rud, MD to address advance care planning and complex medical decision making. This is the initial visit.                                     ASSESSMENT AND PLAN / RECOMMENDATIONS:   Advance Care Planning/Goals of Care: Goals include to maximize quality of life and symptom management. Patient/health care surrogate gave his/her permission to discuss.Our advance care planning conversation included a discussion about:    The value and importance of advance care planning  Experiences with loved ones who have been seriously ill or have died  Exploration of personal, cultural or spiritual beliefs that might influence medical decisions  Exploration of goals of care in the event of a sudden injury or illness  Wife and son share HCPOA Reviewed MOST form today; family to review and will complete on next visit.  CODE STATUS: Full Code  Symptom  Management/Plan:  Alzheimer's Dementia-reorient and redirect as needed.  Monitor for falls and safety. Continue namenda and Depakote as directed. Discussed options of additional support. Son Dannielle Huh is working on [employer] application for aid in attendance. We also discussed dementia day program. Family would like information on day programs. Will refer to Palliative SW for recommendations.   Follow up Palliative Care Visit: Palliative care will continue to follow for complex medical decision making, advance care planning, and clarification of goals. Return 4 weeks or prn.  I spent 60 minutes providing this consultation. More than 50% of the time in this consultation was spent in counseling and care coordination.   PPS: 50%  HOSPICE ELIGIBILITY/DIAGNOSIS: TBD  Chief Complaint: Palliative Initial consult.   HISTORY OF PRESENT ILLNESS:  Cole Mccarty. is a 80 y.o. year old male  with Alzheimer's dementia, ASCVD, moderate mitral insufficiency, complete heart block status post pacemaker placement, type 2 diabetes, hypertension, hyperlipidemia, PVD, GERD, history of lip cancer.   Patient resides at home with wife. Wife expresses caregiver fatigue, she states patient is needing more assistance, direction, cueing with adl's. Children are involved with care as well. Family reports cognitive decline in the past year; he was still able to drive a year ago. Further decline in past couple of months. Patient denies having any pain, shortness of breath, constipation or nausea. Patient has a suprapubic catheter; changed out monthly. Likes to ambulate; there are plans for  patient to start physical therapy for exercise. Has shuffling gait. Gait has worsened recently, tripping and falling more. Family expresses concern due to ambulating around neighborhood. Wife states his ability fluctuates. Some days he cannot remember how to brush his teeth. He exhibits agitation towards his wife, worse in the evenings. He was started on  Depakote 250 ER last week. Appetite is good; wife notices some weight gain. Poor dentition secondary to sepsis. A 10-point review of systems is negative, except for the pertinent positives and negatives detailed in the HPI.     History obtained from review of EMR, discussion with primary team, and interview with family, facility staff/caregiver and/or Mr. Kirschenmann.  I reviewed available labs, medications, imaging, studies and related documents from the EMR.  Records reviewed and summarized above.    Physical Exam: Pulse 72, resp 16, b/p 120/70, sats 97% on room air Constitutional: NAD General: frail appearing EYES: anicteric sclera, lids intact, no discharge  ENMT: hard of hearing; hears better in left ear, oral mucous membranes moist, ill dentition  CV: S1S2, RRR, no LE edema Pulmonary: LCTA, no increased work of breathing, no cough Abdomen: normo-active BS + 4 quadrants, soft and non tender GU: urine in drainage bag clear, yellow MSK: moves all extremities, shuffled gait Skin: warm and dry, no rashes or wounds on visible skin Neuro:  no generalized weakness, A & O x 2, forgetful, some word finding difficulty Psych: non-anxious affect, pleasant Hem/lymph/immuno: no widespread bruising CURRENT PROBLEM LIST:  Patient Active Problem List   Diagnosis Date Noted   Dental caries    Sepsis ([place]) 10/28/2020   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   Diabetes mellitus without complication (HCC)    GERD (gastroesophageal reflux disease)    Hyperlipidemia    Hypertension    Dementia ([place])    Fall    Acute metabolic encephalopathy    Acute urinary retention    Periodontal disease    PAST MEDICAL HISTORY:  Active Ambulatory Problems    Diagnosis Date Noted   Sepsis ([place]) 10/28/2020   Diabetes mellitus without complication (HCC)    GERD (gastroesophageal reflux disease)    Hyperlipidemia    Hypertension    Dementia ([place])    Fall    Acute metabolic encephalopathy    Acute urinary retention     Periodontal disease    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   Dental caries    Resolved Ambulatory Problems    Diagnosis Date Noted   No Resolved Ambulatory Problems   Past Medical History:  Diagnosis Date   Cancer ([place])    Complete heart block (HCC)    Hard of hearing    Presence of permanent cardiac pacemaker    PVD (peripheral vascular disease) (HCC)    SOCIAL HX:  Social History   Tobacco Use   Smoking status: Former    Years: 30.00    Types: Cigarettes    Quit date: 07/11/1989    Years since quitting: 31.7   Smokeless tobacco: Never  Substance Use Topics   Alcohol use: Not Currently   FAMILY HX:  Family History  Problem Relation Age of Onset   Stroke Sister    Lung disease Sister        s/p of transplantation      ALLERGIES:  Allergies  Allergen Reactions   Ezetimibe Other (See Comments)    unknown   Niacin Other (See Comments)    Unknown   Niacin-Lovastatin Er     Unknown   Simvastatin  Unknown     PERTINENT MEDICATIONS:  Outpatient Encounter Medications as of 04/20/2021  Medication Sig   amLODipine (NORVASC) 5 MG tablet Take 5 mg by mouth daily.   aspirin EC 81 MG tablet Take 81 mg by mouth daily.   ciprofloxacin (CIPRO) 500 MG tablet Take 1 tablet (500 mg total) by mouth 2 (two) times daily.   glipiZIDE (GLUCOTROL XL) 2.5 MG 24 hr tablet Take 2.5 mg by mouth daily.   losartan-hydrochlorothiazide (HYZAAR) 100-25 MG tablet Take 1 tablet by mouth daily.   memantine (NAMENDA) 10 MG tablet Take 10 mg by mouth 2 (two) times daily.   metFORMIN (GLUCOPHAGE) 1000 MG tablet Take 1,000 mg by mouth 2 (two) times daily.    rosuvastatin (CRESTOR) 20 MG tablet Take 20 mg by mouth daily.   traMADol (ULTRAM) 50 MG tablet Take 1 tablet (50 mg total) by mouth every 6 (six) hours as needed. (Patient not taking: Reported on 12/21/2020)   No facility-administered encounter medications on file as of 04/20/2021.   Thank you for the opportunity to participate in the care of Mr.  Cogdell.  The palliative care team will continue to follow. Please call our office at [phone] if we can be of additional assistance.   Ezekiel Slocumb, NP   COVID-19 PATIENT SCREENING TOOL Asked and negative response unless otherwise noted:  Have you had symptoms of covid, tested positive or been in contact with someone with symptoms/positive test in the past 5-10 days? No

## 2021-04-23 ENCOUNTER — Telehealth: Payer: Self-pay

## 2021-04-23 NOTE — Telephone Encounter (Signed)
04/23/21 @9 :45 AM: Palliative care SW outreached patients wife per SW referral from [facility] NP - L. Rivers, to discuss caregiver resources and Adult day programs.   Spouse shared that patient has advancing Alzheimer's and it is becoming taxing keeping him preoccupied during the day as well trying keep his mind stimulated. Spouse shares that patient has always been talkative and social and enjoys walking and since his Alzheimer dx he has been instructed to not walk around the neighborhood - which he enjoyed doing. Family feels a day program would beneficial for patient as well aas offset some caregiver stress for sposue.   SW discussed Adult day program in [place] to include: the [facility] program at [facility], [employer] Adult day program as well as [facility]. Spouse shared their son is attempting to apply for VA benefits for patient as well as patient was in [employer], but she is unsure of the years.  SW provided reflective and supportive listening to spouse as she shared some stressors to care for patient such as ensuring he toilets successfully for proper hygiene, fall safety concerns and patients continued cognitive decline.   SW will mail adult day program resources to spouse to include SW contact info. Spouse appreciative of call and shared no other concerns/questions at this time.

## 2021-05-14 ENCOUNTER — Other Ambulatory Visit: Payer: Self-pay

## 2021-05-14 ENCOUNTER — Ambulatory Visit: Payer: Medicare PPO | Admitting: Physician Assistant

## 2021-05-14 DIAGNOSIS — Z435 Encounter for attention to cystostomy: Secondary | ICD-10-CM

## 2021-05-14 DIAGNOSIS — R339 Retention of urine, unspecified: Secondary | ICD-10-CM

## 2021-05-14 NOTE — Progress Notes (Signed)
Suprapubic Cath Change  Patient is present today for a suprapubic catheter change due to urinary retention.  30ml of water was drained from the balloon, a 16FR foley cath was removed from the tract without difficulty.  Site was cleaned and prepped in a sterile fashion with betadine.  A 16FR foley cath was replaced into the tract no complications were noted. Urine return was noted, 10 ml of sterile water was inflated into the balloon and a leg bag was attached for drainage.  Patient tolerated well.     Performed by: Debroah Loop, PA-C   Follow up: Return in about 4 weeks (around 06/11/2021) for SPT exchange.

## 2021-05-27 ENCOUNTER — Other Ambulatory Visit: Payer: Self-pay

## 2021-05-27 ENCOUNTER — Other Ambulatory Visit: Payer: Medicare PPO | Admitting: Student

## 2021-05-27 DIAGNOSIS — G309 Alzheimer's disease, unspecified: Secondary | ICD-10-CM

## 2021-05-27 DIAGNOSIS — G4700 Insomnia, unspecified: Secondary | ICD-10-CM

## 2021-05-27 DIAGNOSIS — Z515 Encounter for palliative care: Secondary | ICD-10-CM
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
## 2021-05-28 ENCOUNTER — Telehealth: Payer: Self-pay

## 2021-05-28 NOTE — Telephone Encounter (Signed)
05/28/21 @12 :15 PM: PC SW outreached patients spouse, Doran Heater, returning her TC to SW.  Spouse shared that she was attempting to get an update on [place] applications that her DIL submitted for patient. SW provided spouse with local [place] office contact Kirke Shaggy to gain insight on status.

## 2021-06-11 ENCOUNTER — Other Ambulatory Visit: Payer: Self-pay

## 2021-06-11 ENCOUNTER — Ambulatory Visit: Payer: Medicare PPO | Admitting: Physician Assistant

## 2021-06-11 DIAGNOSIS — Z435 Encounter for attention to cystostomy: Secondary | ICD-10-CM | POA: Diagnosis not present

## 2021-06-11 NOTE — Progress Notes (Signed)
Suprapubic Cath Change  Patient is present today for a suprapubic catheter change due to urinary retention.  53ml of water was drained from the balloon, a 16FR foley cath was removed from the tract without difficulty.  Site was cleaned and prepped in a sterile fashion with betadine.  A 16FR foley cath was replaced into the tract no complications were noted. Urine return was noted, 10 ml of sterile water was inflated into the balloon and a leg bag was attached for drainage.  Patient tolerated well.     Performed by: Debroah Loop, PA-C   Follow up: Return in about 4 weeks (around 07/09/2021) for SPT exchange.

## 2021-06-29 ENCOUNTER — Other Ambulatory Visit: Payer: Medicare PPO | Admitting: Student

## 2021-06-29 ENCOUNTER — Other Ambulatory Visit: Payer: Self-pay

## 2021-06-29 DIAGNOSIS — G309 Alzheimer's disease, unspecified: Secondary | ICD-10-CM

## 2021-06-29 DIAGNOSIS — G4700 Insomnia, unspecified: Secondary | ICD-10-CM

## 2021-06-29 DIAGNOSIS — Z515 Encounter for palliative care: Secondary | ICD-10-CM

## 2021-06-29 NOTE — Progress Notes (Signed)
[facility] Consult Note Telephone: [phone]  Fax: [phone]    Date of encounter: 06/29/21 11:44 AM PATIENT NAME: Cole Mccarty   [phone] (home)  DOB: 10/26/1940 MRN: [id number] PRIMARY CARE PROVIDER:    Derinda Late, MD,  [address] and Internal Medicine [place] [address] [phone]  REFERRING PROVIDER:   Derinda Late, MD [phone] [address] [place] and Internal Medicine [place],  [place] [address] [phone]  RESPONSIBLE PARTY:    Contact Information     Name Relation Home Work Mobile   Sanel, Stemmer Spouse [phone]     Brynn, Reznik [phone]   Daimon, Kean [phone]  [phone]   [name] Daughter [phone]  [phone]   Yanixan, Mellinger [phone]  [phone]       Telehealth statement: Due to the COVID-19 crisis, this home visit was done via telephone due to the patient's inability to connect via an audiovisual connection or their refusal to have an in-person visit. This  connection method was agreed to by the patient/family.                                    ASSESSMENT AND PLAN / RECOMMENDATIONS:   Advance Care Planning/Goals of Care: Goals include to maximize quality of life and symptom management. Our advance care planning conversation included a discussion about:    The value and importance of advance care planning  Experiences with loved ones who have been seriously ill or have died  Exploration of personal, cultural or spiritual beliefs that might influence medical decisions  Exploration of goals of care in the event of a sudden injury or illness CODE STATUS: Full code  Symptom Management/Plan:  Late onset Alzheimer's Dementia- patient having cognitive decline. He is requiring more assist with adl's, reorientation and redirection. He  continues to have intermittent agitation. Wife is encouraged to adjust the timing of his evening Depakote as he is currently taking his evening dose around 5 pm. Encouraged to give 10-12 hours apart. We discussed placement as it is becoming more difficulty for wife to care for patient in the home. Patient will not attend a dementia day program. We also discussed respite as an option, but wife feels patient will not agree to this. Will refer to palliative SW regarding long range planning.   Insomnia- continue mirtazapine 7.5 mg QHS. His sleep varies; he will sometimes awaken during the night and will then sleep the next day. Discussed adjusting the time of his Depakote before increasing his mirtazapine.   Follow up Palliative Care Visit: Palliative care will continue to follow for complex medical decision making, advance care planning, and clarification of goals. Return in 6-8 weeks or prn.  I spent 30 minutes providing this consultation. More than 50% of the time in this consultation was spent in counseling and care coordination.   PPS: 50%  HOSPICE ELIGIBILITY/DIAGNOSIS: TBD  Chief Complaint: Palliative Medicine follow up visit.  HISTORY OF PRESENT ILLNESS:  Cole Mccarty. is a 80 y.o. year old male  with  Late onset Alzheimer's dementia, ASCVD, moderate mitral insufficiency, complete heart block status post pacemaker placement, type 2 diabetes, hypertension, hyperlipidemia, PVD, GERD, history of lip cancer.   Patient resides at home with wife. Wife feels his memory is getting worse, increased confusion, having  more difficulty comprehending. She is having to help more with adl's, care for his suprapubic catheter. He has some nights where he is up during the night. Appetite is still good. No recent falls although he is unsteady on his feet. Wife states her daughter in law has sent information to [place]; but they have not heard anything from the [place] yet. No recent ED visits or hospitalizations. A  10-point review of systems is negative, except for the pertinent positives and negatives detailed in the HPI.   History obtained from review of EMR, discussion with primary team, and interview with family, facility staff/caregiver and/or Cole Mccarty.  I reviewed available labs, medications, imaging, studies and related documents from the EMR.  Records reviewed and summarized above.   ROS General: NAD EYES: denies vision changes ENMT: denies dysphagia Cardiovascular: denies chest pain, denies DOE Pulmonary: denies cough, denies increased SOB Abdomen: endorses good appetite, denies constipation, endorses continence of bowel GU: suprapubic catheter MSK:  denies weakness,  no falls reported Skin: denies rashes or wounds Neurological: denies pain, sleep difficulty Psych: Endorses stable mood Heme/lymph/immuno: denies bruises, abnormal bleeding  Physical Exam: PE deferred due to this being a Tele health visit.   Thank you for the opportunity to participate in the care of Cole Mccarty.  The palliative care team will continue to follow. Please call our office at [phone] if we can be of additional assistance.   Ezekiel Slocumb, NP   COVID-19 PATIENT SCREENING TOOL Asked and negative response unless otherwise noted:   Have you had symptoms of covid, tested positive or been in contact with someone with symptoms/positive test in the past 5-10 days? No

## 2021-07-08 ENCOUNTER — Telehealth: Payer: Self-pay

## 2021-07-08 NOTE — Telephone Encounter (Signed)
07/08/21 @11  AM : PC SW outreached patients son, Kasandra Knudsen, to f/u on PC NP request of assisting with long term  planning, as spouse os overwhelmed with patient care needs.  Son shared that he is aware of patient needs and had completed a VA application to see what benefits patient could qualify for, SW provided son with [place] specialist, Kirke Shaggy [phone], contact to info to outreach  for further assistance and guidance with VA benefits. Son appreciative of call and has SW contact info.

## 2021-07-13 ENCOUNTER — Ambulatory Visit: Payer: Medicare PPO | Admitting: Physician Assistant

## 2021-07-14 ENCOUNTER — Telehealth: Payer: Self-pay

## 2021-07-14 DIAGNOSIS — Z515 Encounter for palliative care: Secondary | ICD-10-CM

## 2021-07-14 NOTE — Telephone Encounter (Signed)
(  2:10 pm) Palliative care SW returned call to patient's wife to provide support to her. She advised that patient is currently in the hospital and he is to be discharged to rehab for 3/4 weeks and then to a memory care facility. His wife expressed that she was confused about the process, but has since talked with the hospital SW who is responsible for transferring patient to the rehab. SW provided education regarding VA services which the wife had questions about. SW encouraged her to call with any questions or concerns and if/when patient is discharged. No other concerns noted.

## 2021-07-27 ENCOUNTER — Telehealth: Payer: Self-pay | Admitting: Student

## 2021-07-27 NOTE — Telephone Encounter (Signed)
Palliative NP left message to follow up on patient; awaiting return call.

## 2021-08-12 ENCOUNTER — Telehealth: Payer: Self-pay | Admitting: Student

## 2021-08-12 DIAGNOSIS — Z515 Encounter for palliative care: Secondary | ICD-10-CM

## 2021-08-12 NOTE — Progress Notes (Signed)
COMMUNITY PALLIATIVE CARE SW NOTE  PATIENT NAME: Cole TOSO Sr. DOB: July 03, 1941 MRN: [id number]  PRIMARY CARE PROVIDER: Derinda Late, MD  RESPONSIBLE PARTY: There is no guarantor information entered for this encounter.  PC SW outreached patients spouse, per [facility] NP - L. Rivers, request to discuss patient needs and placement options.  Spouse shared that patient was in the hospital for 2 weeks at the beginning of Jan for [place]. The hospital had anticipated to DC to a SNF rehab, however spouse DC patient to home, per pt wishes. Since being home spouse share that she is experiencing caregiver fatigue. Spouse share that patient is having more of a cognitive decline and is no longer able to recognize her and becoming resistant with care.   Pt is able to feed self and is ambulatory w/o an AD. Spouse reported no falls since DC from the hospital. Spouse has to assist with dressing and bathing as pts temporal lobe for memory recall on how to do these things properly is effected.   Spouse share that she is now interested in placement for pt. Spouse share that she knows there will be a lot of work to put in in order to place patient but she feels this is best. SW suggested SW outreach patients son, Kasandra Knudsen, who is involved to discuss placement  options and process, as it could be overwhelming for spouse. Spouse agreed.   12:30 PM: SW outreached patients son, Kasandra Knudsen, to discuss placement for patient.  Call unsuccessful. SW LVM. Awaiting return call.      SOCIAL HX:  Social History   Tobacco Use   Smoking status: Former    Years: 30.00    Types: Cigarettes    Quit date: 07/11/1989    Years since quitting: 32.1   Smokeless tobacco: Never  Substance Use Topics   Alcohol use: Not Currently        Cole Mccarty, Cole Mccarty

## 2021-08-12 NOTE — Telephone Encounter (Signed)
Returned call to [place] at Dr. Elzie Rings office. She states wife had reached out regarding assistance with placement as was seeing if palliative SW can assist with this. She is made aware that SW has been in contact with family in the past. Will refer to palliative SW for assistance and will have patient scheduled for f/u visit since he was last seen by Palliative.

## 2021-08-17 NOTE — Progress Notes (Signed)
Suprapubic Cath Change  Patient is present today for a suprapubic catheter change due to urinary retention.  9 ml of water was drained from the balloon, a 16 FR foley cath was removed from the tract with out difficulty.  Site was cleaned and prepped in a sterile fashion with betadine.  A 16 FR foley cath was replaced into the tract no complications were noted. Urine return was noted, 10 ml of sterile water was inflated into the balloon and a leg bag was attached for drainage.  Patient tolerated well.   Performed by: Lynell Kussman, PA-C   Follow up: One month for SPT exchange   

## 2021-08-18 ENCOUNTER — Ambulatory Visit: Payer: Medicare PPO | Admitting: Urology

## 2021-08-18 ENCOUNTER — Other Ambulatory Visit: Payer: Self-pay

## 2021-08-18 DIAGNOSIS — Z435 Encounter for attention to cystostomy: Secondary | ICD-10-CM

## 2021-08-18 NOTE — Patient Instructions (Addendum)
[address], [place], [place] [address] [phone] [email or web address]

## 2021-09-01 ENCOUNTER — Telehealth: Payer: Self-pay

## 2021-09-01 NOTE — Telephone Encounter (Signed)
440 pm.  Message received from wife [place] requesting a call back to schedule a home visit with NP.  Return call made and visit scheduled for 3/16@ 11 am.

## 2021-09-21 ENCOUNTER — Encounter: Payer: Self-pay | Admitting: Urology

## 2021-09-21 ENCOUNTER — Ambulatory Visit: Payer: Medicare PPO | Admitting: Urology

## 2021-09-21 ENCOUNTER — Other Ambulatory Visit: Payer: Self-pay

## 2021-09-21 DIAGNOSIS — Z435 Encounter for attention to cystostomy: Secondary | ICD-10-CM

## 2021-09-21 NOTE — Progress Notes (Signed)
Suprapubic Cath Change  Patient is present today for a suprapubic catheter change due to urinary retention.  9 ml of water was drained from the balloon, a 16 FR foley cath was removed from the tract with out difficulty.  Site was cleaned and prepped in a sterile fashion with betadine.  A 16 FR foley cath was replaced into the tract no complications were noted. Urine return was noted, 10 ml of sterile water was inflated into the balloon and a leg bag was attached for drainage.  Patient tolerated well.   Performed by: Keviana Guida, PA-C   Follow up: One month for SPT exchange   

## 2021-09-23 ENCOUNTER — Other Ambulatory Visit: Payer: Self-pay

## 2021-09-23 ENCOUNTER — Other Ambulatory Visit: Payer: Medicare PPO | Admitting: Student

## 2021-09-23 NOTE — Progress Notes (Signed)
? ? ?Manufacturing engineer ?Community Palliative Care Consult Note ?Telephone: [phone]  ?Fax: [phone]  ? ? ?Date of encounter: 09/23/21 ?11:10 AM ?PATIENT NAME: Cole HERDER Sr. ?[address] ?[place] [address]   ?[phone] (home)  ?DOB: 1940-12-01 ?MRN: [id number] ?PRIMARY CARE PROVIDER:    ?Derinda Late, MD,  ?[address] [facility] ?[place] [address] ?[phone] ? ?REFERRING PROVIDER:   ?Derinda Late, MD ?[address] [facility] ?[place],  [place] [address] ?[phone] ? ?RESPONSIBLE PARTY:    ?Contact Information   ? ? Name Relation Home Work Mobile  ? Cole, Mccarty Spouse [phone]    ? [place] Other [phone]  ? Cole, Mccarty Son [phone]  [phone]  ? Mccarty,Cole Daughter [phone]  [phone]  ? Cole, Mccarty [phone]  [phone]  ? ?  ? ? ? ?I met face to face with patient and family in the home. Palliative Care was asked to follow this patient by consultation request of  Derinda Late, MD to address advance care planning and complex medical decision making. This is a follow up visit. ? ?                                 ASSESSMENT AND PLAN / RECOMMENDATIONS:  ? ?Advance Care Planning/Goals of Care: Goals include to maximize quality of life and symptom management. Patient/health care surrogate gave his/her permission to discuss. ?Our advance care planning conversation included a discussion about:    ?The value and importance of advance care planning  ?Experiences with loved ones who have been seriously ill or have died  ?Exploration of personal, cultural or spiritual beliefs that might influence medical decisions  ?Exploration of goals of care in the event of a sudden injury or illness  ?CODE STATUS: Full Code ? ?Education provided on palliative medicine. Wife would like for patient to remain in the home. Palliative medicine  will continue to provide supportive care.  ? ?Symptom Management/Plan: ? ?Late onset Alzheimer's Dementia with agitation-behaviors have been managed; continue namenda, Depakote and mirtazapine. Wife would like for patient to remain in the home; encourage day program. Reorient and redirect as needed; monitor for falls and safety.  ? ?Insomnia-patient sleeping well; continue mirtazapine 7.5 mg QHS.  ? ?Follow up Palliative Care Visit: Palliative care will continue to follow for complex medical decision making, advance care planning, and clarification of goals. Return in 8 weeks or prn. ? ? ?This visit was coded based on medical decision making (MDM). ? ?PPS: 50% ? ?HOSPICE ELIGIBILITY/DIAGNOSIS: TBD ? ?Chief Complaint: Palliative Medicine follow up visit.  ? ?HISTORY OF PRESENT ILLNESS:  Nathian Mccarty. is a 81 y.o. year old male  with Late onset Alzheimer's dementia, ASCVD, moderate mitral insufficiency, complete heart block status post pacemaker placement, type 2 diabetes, hypertension, hyperlipidemia, PVD, GERD, history of lip cancer.   ? ?Patient resides at home with wife. In hospital x 2 weeks in January d/t Covid-19 infection. Wife states he is back to baseline. He has been calmer, behaviors managed with current regimen although he does have occasional episodes where he gets agitated. 1 fall in past month. Suprapubic catheter changed out yesterday per urology. He is sleeping well. A 10-point review of systems is negative, except for the pertinent positives and negatives detailed in the HPI.  ? ?History obtained from review of EMR, discussion  with primary team, and interview with family, facility staff/caregiver and/or Mr. Moree.  ?I reviewed available labs, medications, imaging, studies and related documents from the EMR.  Records reviewed and summarized above.  ? ?Physical Exam: ?Pulse 76, resp 16, b/p 136/78, sats 97% on room air ?Constitutional: NAD ?General: frail appearing ?EYES: anicteric sclera, lids  intact, no discharge  ?ENMT: hard of hearing, oral mucous membranes moist ?CV: S1S2, RRR, no LE edema ?Pulmonary: LCTA, no increased work of breathing, no cough, room air ?Abdomen: intake 100%, normo-active BS + 4 quadrants, soft and non tender, no ascites ?GU: deferred ?MSK: no sarcopenia, moves all extremities, ambulatory ?Skin: warm and dry, no rashes or wounds on visible skin ?Neuro: generalized weakness, A  & O x 2, forgetful ?Psych: non-anxious affect, pleasant, cooperative ?Hem/lymph/immuno: no widespread bruising ? ? ?Thank you for the opportunity to participate in the care of [name].  The palliative care team will continue to follow. Please call our office at [phone] if we can be of additional assistance.  ? ?Ezekiel Slocumb, NP  ? ?COVID-19 PATIENT SCREENING TOOL ?Asked and negative response unless otherwise noted:  ? ?Have you had symptoms of covid, tested positive or been in contact with someone with symptoms/positive test in the past 5-10 days? No ? ?

## 2021-10-19 NOTE — Progress Notes (Signed)
Suprapubic Cath Change  Patient is present today for a suprapubic catheter change due to urinary retention.  9 ml of water was drained from the balloon, a 16 FR foley cath was removed from the tract with out difficulty.  Site was cleaned and prepped in a sterile fashion with betadine.  A 16 FR foley cath was replaced into the tract no complications were noted. Urine return was noted, 10 ml of sterile water was inflated into the balloon and a leg bag was attached for drainage.  Patient tolerated well.   Performed by: Kalkidan Caudell, PA-C   Follow up: One month for SPT exchange   

## 2021-10-20 ENCOUNTER — Ambulatory Visit: Payer: Medicare PPO | Admitting: Urology

## 2021-10-20 DIAGNOSIS — Z435 Encounter for attention to cystostomy: Secondary | ICD-10-CM | POA: Diagnosis not present

## 2021-10-26 ENCOUNTER — Emergency Department: Payer: Medicare PPO

## 2021-10-26 ENCOUNTER — Inpatient Hospital Stay
Admission: EM | Admit: 2021-10-26 | Discharge: 2021-10-29 | DRG: 698 | Disposition: A | Discharge status: Home Health | Payer: Medicare PPO | Attending: Internal Medicine | Admitting: Internal Medicine

## 2021-10-26 ENCOUNTER — Other Ambulatory Visit: Payer: Self-pay

## 2021-10-26 ENCOUNTER — Encounter: Payer: Self-pay | Admitting: *Deleted

## 2021-10-26 DIAGNOSIS — R6520 Severe sepsis without septic shock: Secondary | ICD-10-CM | POA: Diagnosis not present

## 2021-10-26 DIAGNOSIS — Y828 Other medical devices associated with adverse incidents: Secondary | ICD-10-CM | POA: Diagnosis present

## 2021-10-26 DIAGNOSIS — Z950 Presence of cardiac pacemaker: Secondary | ICD-10-CM | POA: Diagnosis not present

## 2021-10-26 DIAGNOSIS — N179 Acute kidney failure, unspecified: Secondary | ICD-10-CM | POA: Diagnosis present

## 2021-10-26 DIAGNOSIS — E1151 Type 2 diabetes mellitus with diabetic peripheral angiopathy without gangrene: Secondary | ICD-10-CM | POA: Diagnosis present

## 2021-10-26 DIAGNOSIS — J189 Pneumonia, unspecified organism: Secondary | ICD-10-CM

## 2021-10-26 DIAGNOSIS — Z79899 Other long term (current) drug therapy: Secondary | ICD-10-CM | POA: Diagnosis not present

## 2021-10-26 DIAGNOSIS — N390 Urinary tract infection, site not specified: Secondary | ICD-10-CM | POA: Diagnosis present

## 2021-10-26 DIAGNOSIS — G9341 Metabolic encephalopathy: Secondary | ICD-10-CM | POA: Diagnosis present

## 2021-10-26 DIAGNOSIS — Z8505 Personal history of malignant neoplasm of liver: Secondary | ICD-10-CM | POA: Diagnosis not present

## 2021-10-26 DIAGNOSIS — F0390 Unspecified dementia without behavioral disturbance: Secondary | ICD-10-CM | POA: Diagnosis present

## 2021-10-26 DIAGNOSIS — J9811 Atelectasis: Secondary | ICD-10-CM | POA: Diagnosis present

## 2021-10-26 DIAGNOSIS — Z7982 Long term (current) use of aspirin: Secondary | ICD-10-CM

## 2021-10-26 DIAGNOSIS — Z85819 Personal history of malignant neoplasm of unspecified site of lip, oral cavity, and pharynx: Secondary | ICD-10-CM

## 2021-10-26 DIAGNOSIS — H9190 Unspecified hearing loss, unspecified ear: Secondary | ICD-10-CM | POA: Diagnosis present

## 2021-10-26 DIAGNOSIS — A419 Sepsis, unspecified organism: Secondary | ICD-10-CM | POA: Diagnosis present

## 2021-10-26 DIAGNOSIS — Z7984 Long term (current) use of oral hypoglycemic drugs: Secondary | ICD-10-CM | POA: Diagnosis not present

## 2021-10-26 DIAGNOSIS — G309 Alzheimer's disease, unspecified: Secondary | ICD-10-CM | POA: Diagnosis not present

## 2021-10-26 DIAGNOSIS — T83518A Infection and inflammatory reaction due to other urinary catheter, initial encounter: Principal | ICD-10-CM | POA: Diagnosis present

## 2021-10-26 DIAGNOSIS — Z20822 Contact with and (suspected) exposure to covid-19: Secondary | ICD-10-CM | POA: Diagnosis present

## 2021-10-26 DIAGNOSIS — Z823 Family history of stroke: Secondary | ICD-10-CM

## 2021-10-26 DIAGNOSIS — J690 Pneumonitis due to inhalation of food and vomit: Secondary | ICD-10-CM | POA: Diagnosis present

## 2021-10-26 DIAGNOSIS — G9340 Encephalopathy, unspecified: Secondary | ICD-10-CM | POA: Diagnosis not present

## 2021-10-26 DIAGNOSIS — E785 Hyperlipidemia, unspecified: Secondary | ICD-10-CM | POA: Diagnosis present

## 2021-10-26 DIAGNOSIS — I10 Essential (primary) hypertension: Secondary | ICD-10-CM | POA: Diagnosis present

## 2021-10-26 DIAGNOSIS — R000 Tachycardia, unspecified: Secondary | ICD-10-CM | POA: Diagnosis present

## 2021-10-26 DIAGNOSIS — Z9359 Other cystostomy status: Secondary | ICD-10-CM | POA: Diagnosis not present

## 2021-10-26 DIAGNOSIS — Z87891 Personal history of nicotine dependence: Secondary | ICD-10-CM

## 2021-10-26 DIAGNOSIS — E872 Acidosis, unspecified: Secondary | ICD-10-CM | POA: Diagnosis present

## 2021-10-26 DIAGNOSIS — B961 Klebsiella pneumoniae [K. pneumoniae] as the cause of diseases classified elsewhere: Secondary | ICD-10-CM | POA: Diagnosis present

## 2021-10-26 DIAGNOSIS — E119 Type 2 diabetes mellitus without complications: Secondary | ICD-10-CM

## 2021-10-26 DIAGNOSIS — F0283 Dementia in other diseases classified elsewhere, unspecified severity, with mood disturbance: Secondary | ICD-10-CM | POA: Diagnosis not present

## 2021-10-26 DIAGNOSIS — K219 Gastro-esophageal reflux disease without esophagitis: Secondary | ICD-10-CM | POA: Diagnosis present

## 2021-10-26 LAB — URINALYSIS, ROUTINE W REFLEX MICROSCOPIC
Bilirubin Urine: NEGATIVE
Bilirubin Urine: NEGATIVE
Glucose, UA: NEGATIVE mg/dL
Glucose, UA: NEGATIVE mg/dL
Ketones, ur: NEGATIVE mg/dL
Ketones, ur: NEGATIVE mg/dL
Nitrite: NEGATIVE
Nitrite: NEGATIVE
Protein, ur: 30 mg/dL — AB
Protein, ur: 30 mg/dL — AB
Specific Gravity, Urine: 1.014 (ref 1.005–1.030)
Specific Gravity, Urine: 1.015 (ref 1.005–1.030)
Squamous Epithelial / HPF: NONE SEEN (ref 0–5)
Squamous Epithelial / HPF: NONE SEEN (ref 0–5)
WBC, UA: >50 WBC/hpf — ABNORMAL HIGH (ref 0–5)
WBC, UA: >50 WBC/hpf — ABNORMAL HIGH (ref 0–5)
pH: 5 (ref 5.0–8.0)
pH: 5 (ref 5.0–8.0)

## 2021-10-26 LAB — COMPREHENSIVE METABOLIC PANEL
ALT: 14 U/L (ref 0–44)
AST: 19 U/L (ref 15–41)
Albumin: 4.3 g/dL (ref 3.5–5.0)
Alkaline Phosphatase: 61 U/L (ref 38–126)
Anion gap: 12 (ref 5–15)
BUN: 29 mg/dL — ABNORMAL HIGH (ref 8–23)
CO2: 24 mmol/L (ref 22–32)
Calcium: 9.7 mg/dL (ref 8.9–10.3)
Chloride: 102 mmol/L (ref 98–111)
Creatinine, Ser: 1.32 mg/dL — ABNORMAL HIGH (ref 0.61–1.24)
GFR, Estimated: 55 mL/min — ABNORMAL LOW (ref >60)
Glucose, Bld: 190 mg/dL — ABNORMAL HIGH (ref 70–99)
Potassium: 3.7 mmol/L (ref 3.5–5.1)
Sodium: 138 mmol/L (ref 135–145)
Total Bilirubin: 1 mg/dL (ref 0.3–1.2)
Total Protein: 8.4 g/dL — ABNORMAL HIGH (ref 6.5–8.1)

## 2021-10-26 LAB — CBC WITH DIFFERENTIAL/PLATELET
Abs Immature Granulocytes: 0.06 10*3/uL (ref 0.00–0.07)
Basophils Absolute: 0 10*3/uL (ref 0.0–0.1)
Basophils Relative: 0 %
Eosinophils Absolute: 0 10*3/uL (ref 0.0–0.5)
Eosinophils Relative: 0 %
HCT: 44.7 % (ref 39.0–52.0)
Hemoglobin: 14.6 g/dL (ref 13.0–17.0)
Immature Granulocytes: 1 %
Lymphocytes Relative: 14 %
Lymphs Abs: 1.9 10*3/uL (ref 0.7–4.0)
MCH: 28.6 pg (ref 26.0–34.0)
MCHC: 32.7 g/dL (ref 30.0–36.0)
MCV: 87.6 fL (ref 80.0–100.0)
Monocytes Absolute: 2.1 10*3/uL — ABNORMAL HIGH (ref 0.1–1.0)
Monocytes Relative: 16 %
Neutro Abs: 9.1 10*3/uL — ABNORMAL HIGH (ref 1.7–7.7)
Neutrophils Relative %: 69 %
Platelets: 213 10*3/uL (ref 150–400)
RBC: 5.1 MIL/uL (ref 4.22–5.81)
RDW: 14.6 % (ref 11.5–15.5)
WBC: 13.2 10*3/uL — ABNORMAL HIGH (ref 4.0–10.5)
nRBC: 0 % (ref 0.0–0.2)

## 2021-10-26 LAB — LACTIC ACID, PLASMA
Lactic Acid, Venous: 2.7 mmol/L (ref 0.5–1.9)
Lactic Acid, Venous: 2.5 mmol/L (ref 0.5–1.9)

## 2021-10-26 LAB — RESP PANEL BY RT-PCR (FLU A&B, COVID) ARPGX2
Influenza A by PCR: NEGATIVE
Influenza B by PCR: NEGATIVE
SARS Coronavirus 2 by RT PCR: NEGATIVE

## 2021-10-26 LAB — PROTIME-INR
INR: 1 (ref 0.8–1.2)
Prothrombin Time: 13.1 seconds (ref 11.4–15.2)

## 2021-10-26 MED ORDER — ACETAMINOPHEN 325 MG PO TABS: 650.0000 mg | ORAL_TABLET | Freq: Four times a day (QID) | ORAL | Status: DC | PRN

## 2021-10-26 MED ORDER — ACETAMINOPHEN 650 MG RE SUPP: 650.0000 mg | Freq: Four times a day (QID) | RECTAL | Status: DC | PRN

## 2021-10-26 MED ORDER — LACTATED RINGERS IV SOLN: INTRAVENOUS | Status: AC

## 2021-10-26 MED ORDER — SODIUM CHLORIDE 0.9 % IV BOLUS
1000.0000 mL | Freq: Once | INTRAVENOUS | Status: AC
  Administered 2021-10-26: 1000 mL via INTRAVENOUS

## 2021-10-26 MED ORDER — ONDANSETRON HCL 4 MG/2ML IJ SOLN: 4.0000 mg | Freq: Four times a day (QID) | INTRAMUSCULAR | Status: DC | PRN

## 2021-10-26 MED ORDER — ONDANSETRON HCL 4 MG PO TABS: 4.0000 mg | ORAL_TABLET | Freq: Four times a day (QID) | ORAL | Status: DC | PRN

## 2021-10-26 MED ORDER — PIPERACILLIN-TAZOBACTAM 3.375 G IVPB 30 MIN
3.3750 g | Freq: Once | INTRAVENOUS | Status: AC
  Administered 2021-10-26: 3.375 g via INTRAVENOUS
  Filled 2021-10-26: qty 50

## 2021-10-26 MED ORDER — ENOXAPARIN SODIUM 40 MG/0.4ML IJ SOSY
40.0000 mg | PREFILLED_SYRINGE | INTRAMUSCULAR | Status: DC
  Administered 2021-10-27 – 2021-10-29 (×3): 40 mg via SUBCUTANEOUS
  Filled 2021-10-26 (×3): qty 0.4

## 2021-10-26 MED ORDER — INSULIN ASPART 100 UNIT/ML IJ SOLN
0.0000 [IU] | INTRAMUSCULAR | Status: DC
  Administered 2021-10-27 (×2): 3 [IU] via SUBCUTANEOUS
  Filled 2021-10-26 (×2): qty 1

## 2021-10-26 MED ORDER — SODIUM CHLORIDE 0.9 % IV SOLN
500.0000 mg | INTRAVENOUS | Status: DC
  Administered 2021-10-27 – 2021-10-28 (×2): 500 mg via INTRAVENOUS
  Filled 2021-10-26: qty 5
  Filled 2021-10-26 (×2): qty 500

## 2021-10-26 MED ORDER — AZITHROMYCIN 500 MG IV SOLR
500.0000 mg | Freq: Once | INTRAVENOUS | Status: AC
  Administered 2021-10-26: 500 mg via INTRAVENOUS
  Filled 2021-10-26: qty 5

## 2021-10-26 MED ORDER — PIPERACILLIN-TAZOBACTAM 3.375 G IVPB
3.3750 g | Freq: Three times a day (TID) | INTRAVENOUS | Status: DC
  Administered 2021-10-27 – 2021-10-29 (×7): 3.375 g via INTRAVENOUS
  Filled 2021-10-26 (×7): qty 50

## 2021-10-26 MED ORDER — ACETAMINOPHEN 500 MG PO TABS
1000.0000 mg | ORAL_TABLET | Freq: Once | ORAL | Status: AC
  Administered 2021-10-26: 1000 mg via ORAL
  Filled 2021-10-26: qty 2

## 2021-10-26 NOTE — Assessment & Plan Note (Addendum)
Improved, appears to be at baseline now. ?Neurologic checks with fall and aspiration precautions ?Diet was started and he was able to tolerate well. ?PT is recommending home health which were ordered ?

## 2021-10-26 NOTE — Assessment & Plan Note (Signed)
Suprapubic catheter exchange in the ED ?Continue Zosyn and follow cultures ?

## 2021-10-26 NOTE — Progress Notes (Signed)
Pharmacy Antibiotic Note ? ?Cole CAFARELLI Sr. is a 81 y.o. male admitted on 10/26/2021 with UTI.  Pharmacy has been consulted for Zosyn dosing. ? ?Plan: ?Zosyn 3.375g IV q8h (4 hour infusion).  Pharmacy will continue to follow and will adjust abx dosing as warranted. ? ?Height: '5\' 6"'$  (167.6 cm) ?Weight: 76.7 kg (169 lb) ?IBW/kg (Calculated) : 63.8 ? ?Temp (24hrs), Avg:100.4 ?F (38 ?C), Min:100.4 ?F (38 ?C), Max:100.4 ?F (38 ?C) ? ?Recent Labs  ?Lab 10/26/21 ?1953 10/26/21 ?1954 10/26/21 ?2157  ?WBC  --  13.2*  --   ?CREATININE  --  1.32*  --   ?LATICACIDVEN 2.7*  --  2.5*  ?  ?Estimated Creatinine Clearance: 43.6 mL/min (A) (by C-G formula based on SCr of 1.32 mg/dL (H)).   ? ?Allergies  ?Allergen Reactions  ? Ezetimibe Other (See Comments)  ?  unknown  ? Niacin Other (See Comments)  ?  Unknown  ? Niacin-Lovastatin Er   ?  Unknown  ? Simvastatin   ?  Unknown  ? ? ?Antimicrobials this admission: ?4/18 Azithromycin >> x 5 days ?4/18 Zosyn >>  ? ?Microbiology results: ?4/18 BCx: Pending ?4/18 UCx: Pending  ? ?Thank you for allowing pharmacy to be a part of this patient?s care. ? ?Renda Rolls, PharmD, MBA ?10/26/2021 ?11:55 PM ? ? ?

## 2021-10-26 NOTE — Assessment & Plan Note (Addendum)
-  Resume Namenda and mirtazapine and Depakote. ?-Delirium precautions ?

## 2021-10-26 NOTE — ED Provider Notes (Signed)
? ?[facility] ?Provider Note ? ? ? Event Date/Time  ? First MD Initiated Contact with Patient 10/26/21 2010   ?  (approximate) ? ? ?History  ? ?Weakness ? ? ?HPI ? ?Cole NELLES Sr. is a 81 y.o. 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  Patient's wife notes over the last several days he seemed to be somewhat more weak.  At baseline he has confusion was more confused today.  When she checked his temperature he had a fever of 103.  Suprapubic cath was exchanged last week.  Has had mild cough today but has not complained of anything.  Also with somewhat decreased p.o. intake.  He has difficulty walking at baseline but typically is able to feed himself.  Today she had to hold his utensils for him. ?  ? ?Past Medical History:  ?Diagnosis Date  ?? Cancer [facility])   ? lip cancer  ?? Complete heart block ([place])   ?? Dementia ([place])   ?? Diabetes mellitus without complication ([place])   ?? GERD (gastroesophageal reflux disease)   ?? 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? Hyperlipidemia   ?? Hypertension   ?? Presence of permanent cardiac pacemaker   ?? PVD (peripheral vascular disease) ([place])   ? ? ?Patient Active Problem List  ? Diagnosis Date Noted  ?? Dental caries   ?? Sepsis ([place]) 10/28/2020  ?? 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  ?? Diabetes mellitus without complication ([place])   ?? GERD (gastroesophageal reflux disease)   ?? Hyperlipidemia   ?? Hypertension   ?? Dementia ([place])   ?? Fall   ?? Acute metabolic encephalopathy   ?? Acute urinary retention   ?? Periodontal disease   ? ? ? ?Physical Exam  ?Triage Vital Signs: ?ED Triage Vitals  ?Enc Vitals Group  ?   BP 10/26/21 1948 136/78  ?   Pulse Rate 10/26/21 1948 (!) 101  ?   Resp 10/26/21 1948 (!) 22  ?   Temp 10/26/21 1948 (!) 100.4 ?F (38 ?C)  ?    Temp Source 10/26/21 1948 Oral  ?   SpO2 10/26/21 1948 92 %  ?   Weight 10/26/21 1949 169 lb (76.7 kg)  ?   Height 10/26/21 1949 '5\' 6"'$  (1.676 m)  ?   Head Circumference --   ?   Peak Flow --   ?   Pain Score 10/26/21 1948 0  ?   Pain Loc --   ?   Pain Edu? --   ?   Excl. in [place]? --   ? ? ?Most recent vital signs: ?Vitals:  ? 10/26/21 2100 10/26/21 2200  ?BP: (!) 130/59 120/60  ?Pulse: 89 84  ?Resp: 17 19  ?Temp:    ?SpO2: 95% 93%  ? ? ? ?General: Awake, no distress ?CV:  Good peripheral perfusion.  ?Resp:  Normal effort. Lungs are clear ?Abd:  No distention. No ttp ?Neuro:             Patient is very hard of hearing, oriented to person and place but not year, does follow commands, intermittently does not makes sense when speaking ?Other:  Suprapubic cath in place draining yellow urine ?Dry MM ? ? ?ED Results / Procedures / Treatments  ?Labs ?(all labs ordered are listed, but only abnormal results are  displayed) ?Labs Reviewed  ?COMPREHENSIVE METABOLIC PANEL - Abnormal; Notable for the following components:  ?    Result Value  ? Glucose, Bld 190 (*)   ? BUN 29 (*)   ? Creatinine, Ser 1.32 (*)   ? Total Protein 8.4 (*)   ? GFR, Estimated 55 (*)   ? All other components within normal limits  ?LACTIC ACID, PLASMA - Abnormal; Notable for the following components:  ? Lactic Acid, Venous 2.7 (*)   ? All other components within normal limits  ?CBC WITH DIFFERENTIAL/PLATELET - Abnormal; Notable for the following components:  ? WBC 13.2 (*)   ? Neutro Abs 9.1 (*)   ? Monocytes Absolute 2.1 (*)   ? All other components within normal limits  ?URINALYSIS, ROUTINE W REFLEX MICROSCOPIC - Abnormal; Notable for the following components:  ? Color, Urine YELLOW (*)   ? APPearance CLOUDY (*)   ? Hgb urine dipstick MODERATE (*)   ? Protein, ur 30 (*)   ? Leukocytes,Ua LARGE (*)   ? WBC, UA >50 (*)   ? Bacteria, UA MANY (*)   ? All other components within normal limits  ?CULTURE, BLOOD (ROUTINE X 2)  ?CULTURE, BLOOD (ROUTINE X 2)  ?RESP  PANEL BY RT-PCR (FLU A&B, COVID) ARPGX2  ?URINE CULTURE  ?PROTIME-INR  ?LACTIC ACID, PLASMA  ?URINALYSIS, ROUTINE W REFLEX MICROSCOPIC  ? ? ? ?EKG ?EKG interpreted by myself, tachycardic, atrial sensed ventricular paced no acute ischemic change meeting S[place]ossa criteria ? ? ? ?RADIOLOGY ?I reviewed the chest x-ray which shows patchy infiltrates bilaterally ? ? ?PROCEDURES: ? ?Critical Care performed: Yes, see critical care procedure note(s) ? ?.1-3 Lead EKG Interpretation ?Performed by: Rada Hay, MD ?Authorized by: Rada Hay, MD  ? ?  Interpretation: abnormal   ?  ECG rate assessment: tachycardic   ?  Rhythm: paced   ?  Ectopy: none   ?  Conduction: normal   ? ?The patient is on the cardiac monitor to evaluate for evidence of arrhythmia and/or significant heart rate changes. ? ? ?MEDICATIONS ORDERED IN ED: ?Medications  ?piperacillin-tazobactam (ZOSYN) IVPB 3.375 g (has no administration in time range)  ?azithromycin (ZITHROMAX) 500 mg in sodium chloride 0.9 % 250 mL IVPB (has no administration in time range)  ?sodium chloride 0.9 % bolus 1,000 mL (1,000 mLs Intravenous New Bag/Given 10/26/21 2201)  ?acetaminophen (TYLENOL) tablet 1,000 mg (1,000 mg Oral Given 10/26/21 2200)  ? ? ? ?IMPRESSION / MDM / ASSESSMENT AND PLAN / ED COURSE  ?I reviewed the triage vital signs and the nursing notes. ?             ?               ? ?Differential diagnosis includes, but is not limited to, catheter associated UTI, pneumonia, bacteremia, less likely meningitis/encephalitis, viral illness ? ?Patient is a 81 year old male with indwelling suprapubic catheter and dementia presents with fever and worsening mental status.  Is febrile to 100.4 was febrile to 103 at home.  Blood pressures okay mildly tachycardic sats are borderline.  On exam he is quite confused, his wife notes that he has baseline confusion but seems to be worse and is not able to feed himself today which is abnormal.  Abdomen is benign lungs are  clear.  Suprapubic catheter is draining yellow urine.  Initial urine was collected from the suprapubic bag which has greater than 50 WBCs.  I personally exchange the suprapubic catheter for a fresh  catheter given concern for infection and will send a clean urine from that as well as urine culture.  Reviewed prior culture data which shows Klebsiella Enterococcus and Pseudomonas.  Discussed with pharmacy they recommend Zosyn.  Patient has a leukocytosis of 12 mild AKI and lactate is elevated at 2.7.  We will give a liter of fluid and repeat.  Blood cultures and urine culture sent.  I reviewed his chest x-ray which does have some patchy opacities bilaterally.  We will add on azithromycin for CAP coverage.  Patient also has had some cough so certainly could have underlying For viral illness. will discuss with hospice for admission. ? ?Clinical Course as of 10/26/21 2242  ?Tue Oct 26, 2021  ?2241 Prothrombin Time: 13.1 [KM]  ?  ?Clinical Course User Index ?[KM] Rada Hay, MD  ? ? ? ?FINAL CLINICAL IMPRESSION(S) / ED DIAGNOSES  ? ?Final diagnoses:  ?Sepsis, due to unspecified organism, unspecified whether acute organ dysfunction present [facility])  ?Urinary tract infection without hematuria, site unspecified  ? ? ? ?Rx / DC Orders  ? ?ED Discharge Orders   ? ? None  ? ?  ? ? ? ?Note:  This document was prepared using Dragon voice recognition software and may include unintentional dictation errors. ?  ?Rada Hay, MD ?10/26/21 2221 ? ?

## 2021-10-26 NOTE — Assessment & Plan Note (Signed)
Exchange in the ED ?

## 2021-10-26 NOTE — Assessment & Plan Note (Addendum)
A1c of 7.9.  CBG mildly elevated ?-Continue with SSI ?-Add 5 units of Semglee twice daily ?

## 2021-10-26 NOTE — Assessment & Plan Note (Addendum)
Fever tachycardia and tachypnea with leukocytosis and elevated lactic acid ?Received IV fluid and antibiotics.  Most likely secondary to UTI, pending urine cultures, blood cultures negative.  Lactic acidosis resolved. ?-Continue with Zosyn and Zithromax ?-Follow-up cultures ?

## 2021-10-26 NOTE — ED Triage Notes (Addendum)
Pt has dementia.  Pt has a fever and weakness for 1 day.  Pt has a suprapubic tube.  No n/v/d.   Pt had tylenol at [facility]       ?

## 2021-10-26 NOTE — Assessment & Plan Note (Addendum)
Blood pressure currently stable.  ?-Keep holding home antihypertensives ?-We will resume home medications once blood pressure started trending up ?

## 2021-10-26 NOTE — H&P (Signed)
?History and Physical  ? ? ?Patient: Cole Enrico Yancey Sr. BHA:[id number] DOB: Jul 18, 1940 ?DOA: 10/26/2021 ?DOS: the patient was seen and examined on 10/26/2021 ?PCP: Derinda Late, MD  ?Patient coming from: Home ? ?Chief Complaint:  ?Chief Complaint  ?Patient presents with  ? Weakness  ? ? ?HPI: Cole YOHANNES Sr. is a 81 y.o. male with medical history significant for HTN, HLD, DM, complete heart block s/p pacemaker, liver cancer, dementia, hearing impairment, urinary retention with suprapubic catheter, who presents to the ED with fever, weakness and confusion increased from baseline.  Temperature at home was 103 and patient was unable to feed himself which is unusual for him.  Last suprapubic exchange was last week.  Wife also reports decreased oral intake but no vomiting. ?ED course and data review: Temperature 100.4 with pulse 101 and respirations 22.  WBC 13,000 with lactic acid 2.5.  Creatinine 1.32 above baseline of 0.9 to.  Urinalysis consistent with UTI.  COVID and flu negative.  Chest x-ray showing mild bibasilar opacities likely due to L atelectasis, infection or aspiration are additional considerations.  Patient started on Zosyn due to history of Enterococcus and Pseudomonas UTI.  Also given azithromycin for possible pneumonia.  Sepsis fluids started.  Hospitalist consulted for admission.  ? ?Review of Systems  ?Unable to perform ROS: Mental status change  ? ?Past Medical History:  ?Diagnosis Date  ? Cancer [facility])   ? lip cancer  ? Complete heart block ([place])   ? Dementia ([place])   ? Diabetes mellitus without complication ([place])   ? GERD (gastroesophageal reflux disease)   ? 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 Hyperlipidemia   ? Hypertension   ? Presence of permanent cardiac pacemaker   ? PVD (peripheral vascular disease) ([place])   ? ?Past Surgical History:  ?Procedure Laterality Date  ? CARDIAC CATHETERIZATION    ? COLONOSCOPY    ? COLONOSCOPY WITH PROPOFOL N/A 02/12/2018  ? Procedure: COLONOSCOPY WITH PROPOFOL;  Surgeon:  Lollie Sails, MD;  Location: [facility] ENDOSCOPY;  Service: Endoscopy;  Laterality: N/A;  ? CYSTOSCOPY WITH INSERTION OF UROLIFT N/A 11/30/2020  ? Procedure: CYSTOSCOPY WITH INSERTION OF UROLIFT;  Surgeon: Hollice Espy, MD;  Location: [facility] ORS;  Service: Urology;  Laterality: N/A;  ? INSERT / REPLACE / REMOVE PACEMAKER    ? insert/replace/pacemaker    ? PACEMAKER INSERTION N/A 08/07/2019  ? Procedure: PACEMAKER CHANGE OUT;  Surgeon: Isaias Cowman, MD;  Location: [facility] ORS;  Service: Cardiovascular;  Laterality: N/A;  ? SURGERY OF LIP    ? ?Social History:  reports that he quit smoking about 32 years ago. His smoking use included cigarettes. He has never used smokeless tobacco. He reports that he does not currently use alcohol. He reports that he does not currently use drugs. ? ?Allergies  ?Allergen Reactions  ? Ezetimibe Other (See Comments)  ?  unknown  ? Niacin Other (See Comments)  ?  Unknown  ? Niacin-Lovastatin Er   ?  Unknown  ? Simvastatin   ?  Unknown  ? ? ?Family History  ?Problem Relation Age of Onset  ? Stroke Sister   ? Lung disease Sister   ?     s/p of transplantation  ? ? ?Prior to Admission medications   ?Medication Sig Start Date End Date Taking? Authorizing Provider  ?amLODipine (NORVASC) 5 MG tablet Take 5 mg by mouth daily.    [provider]  ?aspirin EC 81 MG tablet Take 81 mg by mouth  daily.    [provider]  ?divalproex (DEPAKOTE ER) 250 MG 24 hr tablet Take 250 mg by mouth daily.    [provider]  ?glipiZIDE (GLUCOTROL XL) 2.5 MG 24 hr tablet Take 2.5 mg by mouth daily. ?Patient not taking: Reported on 09/23/2021 09/14/20   [provider]  ?glipiZIDE (GLUCOTROL) 5 MG tablet Take 5 mg by mouth daily before breakfast.    [provider]  ?losartan-hydrochlorothiazide (HYZAAR) 100-25 MG tablet Take 1 tablet by mouth daily.    [provider]  ?memantine (NAMENDA) 10 MG tablet Take 10 mg by mouth 2 (two) times daily. 10/02/20    [provider]  ?metFORMIN (GLUCOPHAGE) 1000 MG tablet Take 1,000 mg by mouth 2 (two) times daily.     [provider]  ?mirtazapine (REMERON) 7.5 MG tablet Take 7.5 mg by mouth at bedtime.    [provider]  ?rosuvastatin (CRESTOR) 20 MG tablet Take 20 mg by mouth daily.    [provider]  ? ? ?Physical Exam: ?Vitals:  ? 10/26/21 2033 10/26/21 2100 10/26/21 2200 10/26/21 2230  ?BP: 117/67 (!) 130/59 120/60 119/74  ?Pulse: 92 89 84 81  ?Resp: '18 17 19 '$ (!) 22  ?Temp:      ?TempSrc:      ?SpO2: 96% 95% 93% 95%  ?Weight:      ?Height:      ? ?Physical Exam ?Vitals and nursing note reviewed.  ?Constitutional:   ?   General: He is not in acute distress. ?   Comments: confused  ?HENT:  ?   Head: Normocephalic and atraumatic.  ?Cardiovascular:  ?   Rate and Rhythm: Normal rate and regular rhythm.  ?   Heart sounds: Normal heart sounds.  ?Pulmonary:  ?   Effort: Pulmonary effort is normal.  ?   Breath sounds: Normal breath sounds.  ?Abdominal:  ?   Palpations: Abdomen is soft.  ?   Tenderness: There is no abdominal tenderness.  ?   Comments: Suprapubic catheter  ?Neurological:  ?   Mental Status: He is disoriented.  ? ? ? ?Data Reviewed: ?Relevant notes from primary care and specialist visits, past discharge summaries as available in EHR, including Care Everywhere. ?Prior diagnostic testing as pertinent to current admission diagnoses ?Updated medications and problem lists for reconciliation ?ED course, including vitals, labs, imaging, treatment and response to treatment ?Triage notes, nursing and pharmacy notes and ED provider's notes ?Notable results as noted in HPI ? ? ?Assessment and Plan: ?* Sepsis ([place]) ?Fever tachycardia and tachypnea with leukocytosis and elevated lactic acid ?Continue sepsis fluids ?Treat UTI as outlined ? ?Complicated urinary tract infection ?Suprapubic catheter exchange in the ED ?Continue Zosyn and follow cultures ? ?Pneumonia, possible ?Based on chest x-ray  findings ?Possibility of aspiration ?Continue Zosyn as well as azithromycin ?Aspiration precautions ? ?Suprapubic catheter ([place]) ?Exchange in the ED ? ?Acute metabolic encephalopathy ?Secondary to sepsis ?Neurologic checks with fall and aspiration precautions ?We will keep n.p.o. tonight ? ?AKI (acute kidney injury) ([place]) ?IV hydration and monitor renal function ? ?Dementia ([place]) ?Resume Namenda and mirtazapine and Depakote when safe to swallow ?Delirium precautions ? ?Presence of permanent cardiac pacemaker ?No acute disease suspected ? ?Hypertension ?Blood pressure currently stable.  In the setting of sepsis and AKI will hold amlodipine and losartan for tonight and resume once blood pressure remained stable ? ?Diabetes mellitus without complication ([place]) ?Sliding scale insulin coverage ? ? ? ? ? ? ?Advance Care Planning:  Code Status: Prior  ? ?Consults: none ? ?Family Communication: none ? ?Severity of Illness: ?The appropriate patient status for this patient is INPATIENT. Inpatient status is judged to be reasonable and necessary in order to provide the required intensity of service to ensure the patient's safety. The patient's presenting symptoms, physical exam findings, and initial radiographic and laboratory data in the context of their chronic comorbidities is felt to place them at high risk for further clinical deterioration. Furthermore, it is not anticipated that the patient will be medically stable for discharge from the hospital within 2 midnights of admission.  ? ?* I certify that at the point of admission it is my clinical judgment that the patient will require inpatient hospital care spanning beyond 2 midnights from the point of admission due to high intensity of service, high risk for further deterioration and high frequency of surveillance required.* ? ?Author: ?Athena Masse, MD ?10/26/2021 11:21 PM ? ?For on call review [email or web address].  ?

## 2021-10-26 NOTE — Assessment & Plan Note (Addendum)
Based on chest x-ray findings ?Possibility of aspiration, patient is also having some worsening cough. ?Continue Zosyn as well as azithromycin ?Aspiration precautions ?

## 2021-10-26 NOTE — Assessment & Plan Note (Signed)
No acute disease suspected ?

## 2021-10-26 NOTE — ED Notes (Signed)
Patient's wife Clary Meeker leaving bedside at this time [phone] ?

## 2021-10-26 NOTE — Assessment & Plan Note (Addendum)
Resolved. ?- monitor renal function ?-Avoid nephrotoxins ?

## 2021-10-27 DIAGNOSIS — A419 Sepsis, unspecified organism: Secondary | ICD-10-CM | POA: Diagnosis not present

## 2021-10-27 DIAGNOSIS — F0283 Dementia in other diseases classified elsewhere, unspecified severity, with mood disturbance: Secondary | ICD-10-CM

## 2021-10-27 DIAGNOSIS — N179 Acute kidney failure, unspecified: Secondary | ICD-10-CM

## 2021-10-27 DIAGNOSIS — G9341 Metabolic encephalopathy: Secondary | ICD-10-CM

## 2021-10-27 DIAGNOSIS — N390 Urinary tract infection, site not specified: Secondary | ICD-10-CM | POA: Diagnosis not present

## 2021-10-27 DIAGNOSIS — G309 Alzheimer's disease, unspecified: Secondary | ICD-10-CM

## 2021-10-27 DIAGNOSIS — Z9359 Other cystostomy status: Secondary | ICD-10-CM

## 2021-10-27 LAB — GLUCOSE, CAPILLARY
Glucose-Capillary: 125 mg/dL — ABNORMAL HIGH (ref 70–99)
Glucose-Capillary: 152 mg/dL — ABNORMAL HIGH (ref 70–99)
Glucose-Capillary: 187 mg/dL — ABNORMAL HIGH (ref 70–99)
Glucose-Capillary: 171 mg/dL — ABNORMAL HIGH (ref 70–99)
Glucose-Capillary: 133 mg/dL — ABNORMAL HIGH (ref 70–99)

## 2021-10-27 LAB — BASIC METABOLIC PANEL
Anion gap: 8 (ref 5–15)
BUN: 24 mg/dL — ABNORMAL HIGH (ref 8–23)
CO2: 24 mmol/L (ref 22–32)
Calcium: 8.6 mg/dL — ABNORMAL LOW (ref 8.9–10.3)
Chloride: 107 mmol/L (ref 98–111)
Creatinine, Ser: 1.06 mg/dL (ref 0.61–1.24)
GFR, Estimated: >60 mL/min (ref >60)
Glucose, Bld: 141 mg/dL — ABNORMAL HIGH (ref 70–99)
Potassium: 3.3 mmol/L — ABNORMAL LOW (ref 3.5–5.1)
Sodium: 139 mmol/L (ref 135–145)

## 2021-10-27 LAB — CBC
HCT: 38.9 % — ABNORMAL LOW (ref 39.0–52.0)
Hemoglobin: 12.7 g/dL — ABNORMAL LOW (ref 13.0–17.0)
MCH: 28.7 pg (ref 26.0–34.0)
MCHC: 32.6 g/dL (ref 30.0–36.0)
MCV: 88 fL (ref 80.0–100.0)
Platelets: 159 10*3/uL (ref 150–400)
RBC: 4.42 MIL/uL (ref 4.22–5.81)
RDW: 14.6 % (ref 11.5–15.5)
WBC: 10.9 10*3/uL — ABNORMAL HIGH (ref 4.0–10.5)
nRBC: 0 % (ref 0.0–0.2)

## 2021-10-27 LAB — HEMOGLOBIN A1C
Hgb A1c MFr Bld: 7.9 % — ABNORMAL HIGH (ref 4.8–5.6)
Mean Plasma Glucose: 180.03 mg/dL

## 2021-10-27 LAB — HIV ANTIBODY (ROUTINE TESTING W REFLEX): HIV Screen 4th Generation wRfx: NONREACTIVE

## 2021-10-27 LAB — LACTIC ACID, PLASMA: Lactic Acid, Venous: 1.5 mmol/L (ref 0.5–1.9)

## 2021-10-27 LAB — MAGNESIUM: Magnesium: 1.7 mg/dL (ref 1.7–2.4)

## 2021-10-27 MED ORDER — ROSUVASTATIN CALCIUM 10 MG PO TABS
20.0000 mg | ORAL_TABLET | Freq: Every day | ORAL | Status: DC
  Administered 2021-10-27 – 2021-10-29 (×3): 20 mg via ORAL
  Filled 2021-10-27 (×3): qty 2

## 2021-10-27 MED ORDER — DIVALPROEX SODIUM ER 250 MG PO TB24
250.0000 mg | ORAL_TABLET | Freq: Every day | ORAL | Status: DC
  Administered 2021-10-27 – 2021-10-29 (×3): 250 mg via ORAL
  Filled 2021-10-27 (×4): qty 1

## 2021-10-27 MED ORDER — POTASSIUM CHLORIDE CRYS ER 20 MEQ PO TBCR
40.0000 meq | EXTENDED_RELEASE_TABLET | Freq: Once | ORAL | Status: AC
  Administered 2021-10-27: 40 meq via ORAL
  Filled 2021-10-27: qty 2

## 2021-10-27 MED ORDER — MEMANTINE HCL 5 MG PO TABS
10.0000 mg | ORAL_TABLET | Freq: Two times a day (BID) | ORAL | Status: DC
Start: 2021-10-27 — End: 2021-10-29
  Administered 2021-10-27 – 2021-10-29 (×4): 10 mg via ORAL
  Filled 2021-10-27 (×4): qty 2

## 2021-10-27 MED ORDER — MIRTAZAPINE 15 MG PO TABS
15.0000 mg | ORAL_TABLET | Freq: Every day | ORAL | Status: DC
  Administered 2021-10-27 – 2021-10-28 (×2): 15 mg via ORAL
  Filled 2021-10-27 (×2): qty 1

## 2021-10-27 MED ORDER — INSULIN GLARGINE-YFGN 100 UNIT/ML ~~LOC~~ SOLN
5.0000 [IU] | Freq: Two times a day (BID) | SUBCUTANEOUS | Status: DC
  Administered 2021-10-27 – 2021-10-28 (×2): 5 [IU] via SUBCUTANEOUS
  Filled 2021-10-27 (×2): qty 0.05

## 2021-10-27 MED ORDER — INSULIN ASPART 100 UNIT/ML IJ SOLN
0.0000 [IU] | Freq: Three times a day (TID) | INTRAMUSCULAR | Status: DC
  Administered 2021-10-27: 3 [IU] via SUBCUTANEOUS
  Administered 2021-10-27 – 2021-10-28 (×2): 2 [IU] via SUBCUTANEOUS
  Administered 2021-10-28 (×2): 3 [IU] via SUBCUTANEOUS
  Administered 2021-10-29: 5 [IU] via SUBCUTANEOUS
  Filled 2021-10-27 (×5): qty 1

## 2021-10-27 MED ORDER — PNEUMOCOCCAL 20-VAL CONJ VACC 0.5 ML IM SUSY
0.5000 mL | PREFILLED_SYRINGE | INTRAMUSCULAR | Status: DC
  Filled 2021-10-27: qty 0.5

## 2021-10-27 MED ORDER — ASPIRIN EC 81 MG PO TBEC
81.0000 mg | DELAYED_RELEASE_TABLET | Freq: Every day | ORAL | Status: DC
Start: 2021-10-27 — End: 2021-10-29
  Administered 2021-10-27 – 2021-10-29 (×3): 81 mg via ORAL
  Filled 2021-10-27 (×3): qty 1

## 2021-10-27 NOTE — Evaluation (Addendum)
Physical Therapy Evaluation ?Patient Details ?Name: Cole KASSA Sr. ?MRN: [id number] ?DOB: 11-Sep-1940 ?Today's Date: 10/27/2021 ? ?History of Present Illness ? 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 ?  ?Clinical Impression ? Physical Therapy Evaluation completed this date. Patient tolerated session well and was agreeable to treatment. Upon entry into room patient was resting in bed with wife at bedside. Patient reported no pain. Per wife patient is the sole caregiver for patient. Patient lives in a 1 story home with 2-3 STE and a left handrail. Wife provides increased assistance with bathing, dressing, and feeding. Patient does not utilize an AD at baseline. Due to patient's [facility], tactile cueing and hand gestures work best. Patient demonstrated at least 3-3+/5 strength in BLEs, and was able to complete bed mobility at SUP with tactile and verbal cueing. Sit to stand from EOB was completed with RW at SBA/SUP. Attempted to educated patient on use of RW for ambulation, however due to cognitive deficits and HOH, patient was unable to comprehend. With B HHA patient was able to ambulate ~50 feet with no LOB. Increased shuffling and NBOS was noted. Patient did ambulate ~4-5 steps without any physical assistance at SBA when ambulating back to bed. Patient's wife patient is at/near baseline level of function, however patient would benefit form continued skilled physical therapy in order to decrease caregiver burden, and increase safety/ease with ADLs. Recommend HHPT upon discharge from acute hospitalization.  ? ?Addendum: Due to chat with patient's wife, patient would benefit from long term placement in the future. ?   ? ?Recommendations for follow up therapy are one component of  a multi-disciplinary discharge planning process, led by the attending physician.  Recommendations may be updated based on patient status, additional functional criteria and insurance authorization. ? ?Follow Up Recommendations Home health PT ? ?  ?Assistance Recommended at Discharge Frequent or constant Supervision/Assistance  ?Patient can return home with the following ? A little help with walking and/or transfers;Assistance with cooking/housework;Assistance with feeding;A little help with bathing/dressing/bathroom;Direct supervision/assist for medications management;Assist for transportation;Help with stairs or ramp for entrance;Direct supervision/assist for financial management ? ?  ?Equipment Recommendations Rolling walker (2 wheels)  ?Recommendations for Other Services ?    ?  ?Functional Status Assessment Patient has had a recent decline in their functional status and demonstrates the ability to make significant improvements in function in a reasonable and predictable amount of time.  ? ?  ?Precautions / Restrictions Precautions ?Precautions: Fall ?Restrictions ?Weight Bearing Restrictions: No  ? ?  ? ?Mobility ? Bed Mobility ?Overal bed mobility: Needs Assistance ?Bed Mobility: Supine to Sit, Sit to Supine ?  ?  ?Supine to sit: Supervision ?Sit to supine: Supervision ?  ?General bed mobility comments: cueing on hand/feet placement through verbal and tactile cueing, however no physial assistance required ?  ? ?Transfers ?  ?Equipment used: Rolling walker (2 wheels) (x2 from EOB) ?  ?  ?  ?  ?  ?  ?  ?General transfer comment: cueing for proper hand placement ?  ? ?Ambulation/Gait ?Ambulation/Gait assistance: Min guard (B HHA) ?Gait Distance (Feet): 56 Feet ?Assistive device: 1 person hand held assist ?Gait Pattern/deviations: Narrow base of support, Shuffle ?Gait velocity: decreased ?  ?  ?General Gait Details: mild unsteadiness, however no LOB noted ? ?  Stairs ?  ?  ?  ?  ?  ? ?Wheelchair Mobility ?   ? ?Modified Rankin (Stroke Patients Only) ?  ? ?  ? ?Balance Overall balance assessment: Needs assistance ?Sitting-balance support: No upper extremity supported, Feet supported ?Sitting balance-Leahy Scale: Fair ?Sitting balance - Comments: able to reach outside BOS ?  ?  ?Standing balance-Leahy Scale: Fair ?Standing balance comment: B HHA/ SBA to maintain balance when ambulating ?  ?  ?  ?  ?  ?  ?  ?  ?  ?  ?  ?   ? ? ? ?Pertinent Vitals/Pain Pain Assessment ?Pain Assessment: No/denies pain  ? ? ?Home Living Family/patient expects to be discharged to:: Private residence ?Living Arrangements: Spouse/significant other ?Available Help at Discharge: Family;Available 24 hours/day (Wife is complete caregiver) ?Type of Home: House ?Home Access: Stairs to enter ?Entrance Stairs-Rails: Left ?Entrance Stairs-Number of Steps: 2-3 ?  ?Home Layout: One level ?Home Equipment: Wheelchair - manual ?   ?  ?Prior Function Prior Level of Function : Needs assist ?  ?  ?  ?Physical Assist : Mobility (physical) ?Mobility (physical): Bed mobility;Transfers;Gait;Stairs ?  ?Mobility Comments: no AD at baseline, wife states patient ambulates very very very slowly" ?ADLs Comments: Wife helps with bathing, dressing, feeding ?  ? ? ?Hand Dominance  ?   ? ?  ?Extremity/Trunk Assessment  ? Upper Extremity Assessment ?Upper Extremity Assessment: Generalized weakness (at least 4/5 bilaterally) ?  ? ?Lower Extremity Assessment ?Lower Extremity Assessment: Generalized weakness (at least 3+/5 bilaterally) ?  ? ?   ?Communication  ? Communication: HOH  ?Cognition Arousal/Alertness: Awake/alert, Lethargic ?Behavior During Therapy: [facility] for tasks assessed/performed ?Overall Cognitive Status: History of cognitive impairments - at baseline ?  ?  ?  ?  ?  ?  ?  ?  ?  ?  ?  ?  ?  ?  ?  ?  ?General Comments: Alert and oriented to name, location (with cueing) disoriented to month and situation ?  ?  ? ?  ?General Comments   ? ?  ?Exercises Other  Exercises ?Other Exercises: Patient and family edcuated on role of PT in acute care setting, fall risk, and d/c recommendations  ? ?Assessment/Plan  ?  ?PT Assessment Patient needs continued PT services  ?PT Problem List Decreased strength;Decreased balance;Decreased mobility;Decreased cognition ? ?   ?  ?PT Treatment Interventions DME instruction;Functional mobility training;Balance training;Therapeutic activities;Therapeutic exercise;Gait training;Cognitive remediation   ? ?PT Goals (Current goals can be found in the Care Plan section)  ?Acute Rehab PT Goals ?PT Goal Formulation: Patient unable to participate in goal setting ?Time For Goal Achievement: 11/10/21 ?Potential to Achieve Goals: Fair ? ?  ?Frequency Min 2X/week ?  ? ? ?Co-evaluation   ?  ?  ?  ?  ? ? ?  ?AM-PAC PT "6 Clicks" Mobility  ?Outcome Measure Help needed turning from your back to your side while in a flat bed without using bedrails?: A Little ?Help needed moving from lying on your back to sitting on the side of a flat bed without using bedrails?: A Little ?Help needed moving to and from a bed to a chair (including a wheelchair)?: A Little ?Help needed standing up from a chair using your arms (e.g., wheelchair or bedside chair)?: A Little ?Help needed to walk in hospital room?: A Little ?Help needed climbing 3-5 steps with a railing? : A Lot ?6 Click Score: 17 ? ?  ?End of Session Equipment Utilized  During Treatment: Gait belt ?Activity Tolerance: Patient tolerated treatment well ?Patient left: in bed;with call bell/phone within reach;with bed alarm set;with family/visitor present ?Nurse Communication: Mobility status ?PT Visit Diagnosis: Unsteadiness on feet (R26.81);Muscle weakness (generalized) (M62.81);Difficulty in walking, not elsewhere classified (R26.2) ?  ? ?Time: 1429-1501 ?PT Time Calculation (min) (ACUTE ONLY): 32 min ? ? ?Charges:   PT Evaluation ?$PT Eval Low Complexity: 1 Low ?PT Treatments ?$Gait Training: 8-22 mins ?  ?    ? ? ?Iva Boop, PT  ?10/27/21. 3:21 PM ? ? ?

## 2021-10-27 NOTE — Progress Notes (Signed)
[facility]) Hospital Liaison note: ? ?This patient is currently enrolled in [facility] outpatient-based Palliative Care. Will continue to follow for disposition. ? ?Please call with any outpatient palliative questions or concerns. ? ?Thank you, ?Lorelee Market, LPN ?[facility] Hospital Liaison ?[phone] ?

## 2021-10-27 NOTE — Hospital Course (Addendum)
Taken from H&P. ? ?Cole MAYOTTE Sr. is a 81 y.o. male with medical history significant for HTN, HLD, DM, complete heart block s/p pacemaker, liver cancer, dementia, hearing impairment, urinary retention with suprapubic catheter, who presents to the ED with fever, weakness and confusion increased from baseline.  Temperature at home was 103 and patient was unable to feed himself which is unusual for him.  Last suprapubic exchange was last week.  Wife also reports decreased oral intake but no vomiting. ?ED course and data review: Temperature 100.4 with pulse 101 and respirations 22.  WBC 13,000 with lactic acid 2.5.  Creatinine 1.32 above baseline of 0.9 to.  Urinalysis consistent with UTI.  COVID and flu negative.  Chest x-ray showing mild bibasilar opacities likely due to L atelectasis, infection or aspiration are additional considerations.  Patient started on Zosyn due to history of Enterococcus and Pseudomonas UTI.  Also given azithromycin for possible pneumonia.  Sepsis fluids started.  Hospitalist consulted for admission.  ? ?4/19: Patient was having some cough overnight.  Completely dependent for all ADLs per wife.  Outpatient palliative care follows him.  PT is recommending home health services as patient does not want to go to any facility. ?Lactic acidosis and AKI resolved. ?Urine culture are pending. ?Blood cultures are negative ? ?4/20: No acute concern.  Urine culture with more than 100,000 colonies of gram-negative rods, pending final results.  Wife just want PT at home which was ordered. ? ?4/21: Urine culture with Klebsiella pneumonia and good sensitivity.  Patient received Zosyn while in the hospital and discharged on Keflex for 1 week for treatment of complicated UTI. ? ?Patient remained stable and will continue with current management and will follow-up with his providers.  Home PT was arranged.  Wife would like to take care of rest by herself and does not want any more services at this time and  those can be added by PCP if needed. ?

## 2021-10-27 NOTE — Plan of Care (Signed)
Patient arrived on unit from ED alert to self only. No complaints voiced, no distress noted. Patient settled into room. Will continue to monitor closely. ? ?Problem: Education: ?Goal: Knowledge of General Education information will improve ?Description: Including pain rating scale, medication(s)/side effects and non-pharmacologic comfort measures ?Outcome: Progressing ?  ?Problem: Health Behavior/Discharge Planning: ?Goal: Ability to manage health-related needs will improve ?Outcome: Progressing ?  ?Problem: Clinical Measurements: ?Goal: Ability to maintain clinical measurements within normal limits will improve ?Outcome: Progressing ?Goal: Will remain free from infection ?Outcome: Progressing ?Goal: Diagnostic test results will improve ?Outcome: Progressing ?Goal: Respiratory complications will improve ?Outcome: Progressing ?Goal: Cardiovascular complication will be avoided ?Outcome: Progressing ?  ?Problem: Activity: ?Goal: Risk for activity intolerance will decrease ?Outcome: Progressing ?  ?Problem: Nutrition: ?Goal: Adequate nutrition will be maintained ?Outcome: Progressing ?  ?Problem: Coping: ?Goal: Level of anxiety will decrease ?Outcome: Progressing ?  ?Problem: Elimination: ?Goal: Will not experience complications related to bowel motility ?Outcome: Progressing ?Goal: Will not experience complications related to urinary retention ?Outcome: Progressing ?  ?Problem: Pain Managment: ?Goal: General experience of comfort will improve ?Outcome: Progressing ?  ?Problem: Safety: ?Goal: Ability to remain free from injury will improve ?Outcome: Progressing ?  ?Problem: Skin Integrity: ?Goal: Risk for impaired skin integrity will decrease ?Outcome: Progressing ?  ?

## 2021-10-27 NOTE — ED Notes (Signed)
Pt repositioned in bed, bed alarm in place ?

## 2021-10-27 NOTE — Progress Notes (Signed)
?Progress Note ? ? ?Patient: Cole Dantes Carrollton Sr. JOI:[id number] DOB: [birth year] DOA: 10/26/2021     1 ?DOS: the patient was seen and examined on 10/27/2021 ?  ?Brief hospital course: ?Taken from H&P. ? ?PADEN SENGER Sr. is a 81 y.o. male with medical history significant for HTN, HLD, DM, complete heart block s/p pacemaker, liver cancer, dementia, hearing impairment, urinary retention with suprapubic catheter, who presents to the ED with fever, weakness and confusion increased from baseline.  Temperature at home was 103 and patient was unable to feed himself which is unusual for him.  Last suprapubic exchange was last week.  Wife also reports decreased oral intake but no vomiting. ?ED course and data review: Temperature 100.4 with pulse 101 and respirations 22.  WBC 13,000 with lactic acid 2.5.  Creatinine 1.32 above baseline of 0.9 to.  Urinalysis consistent with UTI.  COVID and flu negative.  Chest x-ray showing mild bibasilar opacities likely due to L atelectasis, infection or aspiration are additional considerations.  Patient started on Zosyn due to history of Enterococcus and Pseudomonas UTI.  Also given azithromycin for possible pneumonia.  Sepsis fluids started.  Hospitalist consulted for admission.  ? ?4/19: Patient was having some cough overnight.  Completely dependent for all ADLs per wife.  Outpatient palliative care follows him.  PT is recommending home health services as patient does not want to go to any facility. ?Lactic acidosis and AKI resolved. ?Urine culture are pending. ?Blood cultures are negative ? ? ?Assessment and Plan: ?* Sepsis ([place]) ?Fever tachycardia and tachypnea with leukocytosis and elevated lactic acid ?Received IV fluid and antibiotics.  Most likely secondary to UTI, pending urine cultures, blood cultures negative.  Lactic acidosis resolved. ?-Continue with Zosyn and Zithromax ?-Follow-up cultures ? ?Complicated urinary tract infection ?Suprapubic catheter exchange in the ED ?Continue Zosyn  and follow cultures ? ?Pneumonia, possible ?Based on chest x-ray findings ?Possibility of aspiration, patient is also having some worsening cough. ?Continue Zosyn as well as azithromycin ?Aspiration precautions ? ?Suprapubic catheter ([place]) ?Exchange in the ED ? ?Acute metabolic encephalopathy ?Improved, appears to be at baseline now. ?Neurologic checks with fall and aspiration precautions ?Diet was started and he was able to tolerate well. ?PT is recommending home health which were ordered ? ?AKI (acute kidney injury) ([place]) ?Resolved. ?- monitor renal function ?-Avoid nephrotoxins ? ?Dementia ([place]) ?-Resume Namenda and mirtazapine and Depakote. ?-Delirium precautions ? ?Presence of permanent cardiac pacemaker ?No acute disease suspected ? ?Hypertension ?Blood pressure currently stable.  ?-Keep holding home antihypertensives ?-We will resume home medications once blood pressure started trending up ? ?Diabetes mellitus without complication ([place]) ?A1c of 7.9.  CBG mildly elevated ?-Continue with SSI ?-Add 5 units of Semglee twice daily ? ? ?Subjective: Patient was seen and examined today.  Having some cough overnight.  Wife at bedside.  Patient is completely dependent for all ADLs and wife helps. ? ?Physical Exam: ?Vitals:  ? 10/27/21 0100 10/27/21 0433 10/27/21 0930 10/27/21 1628  ?BP:  121/64 120/69 111/64  ?Pulse: 76 74 93 78  ?Resp:  '16 16 18  '$ ?Temp:  98.1 ?F (36.7 ?C) 99.2 ?F (37.3 ?C) 99.6 ?F (37.6 ?C)  ?TempSrc:      ?SpO2: 95% 91% 94% 93%  ?Weight:      ?Height:      ? ?General.     In no acute distress. ?Pulmonary.  Lungs clear bilaterally, normal respiratory effort. ?CV.  Regular rate and rhythm, no JVD, rub or murmur. ?Abdomen.  Soft,  nontender, nondistended, BS positive.  Suprapubic catheter in place ?CNS.  Alert and oriented to self.  No focal neurologic deficit. ?Extremities.  No edema, no cyanosis, pulses intact and symmetrical. ?Psychiatry.  Judgment and insight appears impaired ? ?Data Reviewed: ?Prior  notes, labs and images reviewed ? ?Family Communication: Discussed with wife at bedside ? ?Disposition: ?Status is: Inpatient ?Remains inpatient appropriate because: Severity of illness ? ? Planned Discharge Destination: Home with Home Health ? ?DVT prophylaxis.  Lovenox ? ?Time spent: 45 minutes ? ?This record has been created using Systems analyst. Errors have been sought and corrected,but may not always be located. Such creation errors do not reflect on the standard of care. ? ?Author: ?Lorella Nimrod, MD ?10/27/2021 5:16 PM ? ?For on call review [email or web address].  ?

## 2021-10-28 DIAGNOSIS — A419 Sepsis, unspecified organism: Secondary | ICD-10-CM | POA: Diagnosis not present

## 2021-10-28 DIAGNOSIS — G9341 Metabolic encephalopathy: Secondary | ICD-10-CM | POA: Diagnosis not present

## 2021-10-28 DIAGNOSIS — N179 Acute kidney failure, unspecified: Secondary | ICD-10-CM | POA: Diagnosis not present

## 2021-10-28 DIAGNOSIS — N390 Urinary tract infection, site not specified: Secondary | ICD-10-CM | POA: Diagnosis not present

## 2021-10-28 LAB — GLUCOSE, CAPILLARY
Glucose-Capillary: 171 mg/dL — ABNORMAL HIGH (ref 70–99)
Glucose-Capillary: 170 mg/dL — ABNORMAL HIGH (ref 70–99)
Glucose-Capillary: 117 mg/dL — ABNORMAL HIGH (ref 70–99)
Glucose-Capillary: 146 mg/dL — ABNORMAL HIGH (ref 70–99)

## 2021-10-28 MED ORDER — CHLORHEXIDINE GLUCONATE CLOTH 2 % EX PADS
6.0000 | MEDICATED_PAD | Freq: Every day | CUTANEOUS | Status: DC
  Administered 2021-10-28: 6 via TOPICAL

## 2021-10-28 MED ORDER — INSULIN GLARGINE-YFGN 100 UNIT/ML ~~LOC~~ SOLN
8.0000 [IU] | Freq: Two times a day (BID) | SUBCUTANEOUS | Status: DC
  Administered 2021-10-28 – 2021-10-29 (×2): 8 [IU] via SUBCUTANEOUS
  Filled 2021-10-28 (×3): qty 0.08

## 2021-10-28 NOTE — TOC Initial Note (Addendum)
Transition of Care (TOC) - Initial/Assessment Note  ? ? ?Patient Details  ?Name: Cole HARING Sr. ?MRN: [id number] ?Date of Birth: 18-Sep-1940 ? ?Transition of Care (TOC) CM/SW Contact:    ?Candie Chroman, LCSW ?Phone Number: ?10/28/2021, 1:40 PM ? ?Clinical Narrative:   Patient not fully oriented. No supports at bedside. CSW called wife, introduced role, and explained that PT recommendations would be discussed. Wife is agreeable to home health PT. She does not feel that RN or aide are necessary. Reviewed agencies that accept [facility] and she prefers [employer]. Representative is reviewing referral. Patient already has a walker at home. Told her tub/shower stool would be private pay so she will probably order one on [place] or go to [employer]. She is aware of plan to discharge tomorrow and will transport him home. No further concerns. CSW encouraged patient's wife to contact CSW as needed. CSW will continue to follow patient for support and facilitate return home when stable.          ? ?3:18 pm: [facility] is able to accept referral.     ? ?Expected Discharge Plan: [place] ?Barriers to Discharge: Continued Medical Work up ? ? ?Patient Goals and CMS Choice ?Patient states their goals for this hospitalization and ongoing recovery are:: Patient not fully oriented. ?CMS Medicare.gov Compare Post Acute Care list provided to:: Patient Represenative (must comment) (Reviewed with wife over the phone.) ?  ? ?Expected Discharge Plan and Services ?Expected Discharge Plan: [place] ?  ?  ?Post Acute Care Choice: Home Health ?Living arrangements for the past 2 months: [place] ?                ?  ?  ?  ?  ?  ?  ?  ?  ?  ?  ? ?Prior Living Arrangements/Services ?Living arrangements for the past 2 months: [place] ?Lives with:: Spouse ?Patient language and need for interpreter reviewed:: Yes ?Do you feel safe going back to the place where you live?: Yes      ?Need for Family  Participation in Patient Care: Yes (Comment) ?Care giver support system in place?: Yes (comment) ?Current home services: DME ?Criminal Activity/Legal Involvement Pertinent to Current Situation/Hospitalization: No - Comment as needed ? ?Activities of Daily Living ?Home Assistive Devices/Equipment: Wheelchair ?ADL Screening (condition at time of admission) ?Patient's cognitive ability adequate to safely complete daily activities?: No ?Is the patient deaf or have difficulty hearing?: Yes ?Does the patient have difficulty seeing, even when wearing glasses/contacts?: No ?Does the patient have difficulty concentrating, remembering, or making decisions?: Yes ?Patient able to express need for assistance with ADLs?: Yes ?Does the patient have difficulty dressing or bathing?: Yes ?Independently performs ADLs?: No ?Does the patient have difficulty walking or climbing stairs?: Yes ?Weakness of Legs: Both ?Weakness of Arms/Hands: None ? ?Permission Sought/Granted ?Permission sought to share information with : Facility Sport and exercise psychologist, Family Supports ?Permission granted to share information with : Yes, Verbal Permission Granted ? Share Information with NAME: Cole Mccarty ? Permission granted to share info w AGENCY: [place] ? Permission granted to share info w Relationship: Wife ? Permission granted to share info w Contact Information: [phone] ? ?Emotional Assessment ?Appearance:: Appears stated age ?Attitude/Demeanor/Rapport: Unable to Assess ?Affect (typically observed): Unable to Assess ?Orientation: : Oriented to Self ?Alcohol / Substance Use: Not Applicable ?Psych Involvement: No (comment) ? ?Admission diagnosis:  Sepsis ([place]) [A41.9] ?Urinary tract infection without hematuria, site  unspecified [N39.0] ?Sepsis, due to unspecified organism, unspecified whether acute organ dysfunction present ([place]) [A41.9] ?Patient Active Problem List  ? Diagnosis Date Noted  ? Urinary tract infection without  hematuria 10/26/2021  ? Suprapubic catheter ([place]) 10/26/2021  ? AKI (acute kidney injury) ([place]) 10/26/2021  ? Presence of permanent cardiac pacemaker   ? Pneumonia, possible   ? Dental caries   ? Sepsis ([place]) 10/28/2020  ? 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  ? Diabetes mellitus without complication ([place])   ? GERD (gastroesophageal reflux disease)   ? Hyperlipidemia   ? Hypertension   ? Dementia ([place])   ? Fall   ? Acute metabolic encephalopathy   ? Acute urinary retention   ? Periodontal disease   ? ?PCP:  Derinda Late, MD ?Pharmacy:   ?[id number]?Phone: [phone]: [phone]? ? ? ? ?Social Determinants of Health (SDOH) Interventions ?  ? ?Readmission Risk Interventions ?   ? View : No data to display.  ?  ?  ?  ? ? ? ?

## 2021-10-28 NOTE — Assessment & Plan Note (Signed)
Fever tachycardia and tachypnea with leukocytosis and elevated lactic acid ?Received IV fluid and antibiotics.  Most likely secondary to UTI,blood cultures negative.  Lactic acidosis resolved.  Urine cultures with more than 100,000 colonies of gram-negative rods ?-Continue with Zosyn and Zithromax ?-Follow-up cultures ?

## 2021-10-28 NOTE — Assessment & Plan Note (Signed)
Suprapubic catheter exchange in the ED ?Continue Zosyn and follow cultures ?

## 2021-10-28 NOTE — Progress Notes (Signed)
?Progress Note ? ? ?Patient: Cole Salts Caney Sr. SWN:[id number] DOB: 04/19/1941 DOA: 10/26/2021     2 ?DOS: the patient was seen and examined on 10/28/2021 ?  ?Brief hospital course: ?Taken from H&P. ? ?Cole RONDINELLI Sr. is a 81 y.o. male with medical history significant for HTN, HLD, DM, complete heart block s/p pacemaker, liver cancer, dementia, hearing impairment, urinary retention with suprapubic catheter, who presents to the ED with fever, weakness and confusion increased from baseline.  Temperature at home was 103 and patient was unable to feed himself which is unusual for him.  Last suprapubic exchange was last week.  Wife also reports decreased oral intake but no vomiting. ?ED course and data review: Temperature 100.4 with pulse 101 and respirations 22.  WBC 13,000 with lactic acid 2.5.  Creatinine 1.32 above baseline of 0.9 to.  Urinalysis consistent with UTI.  COVID and flu negative.  Chest x-ray showing mild bibasilar opacities likely due to L atelectasis, infection or aspiration are additional considerations.  Patient started on Zosyn due to history of Enterococcus and Pseudomonas UTI.  Also given azithromycin for possible pneumonia.  Sepsis fluids started.  Hospitalist consulted for admission.  ? ?4/19: Patient was having some cough overnight.  Completely dependent for all ADLs per wife.  Outpatient palliative care follows him.  PT is recommending home health services as patient does not want to go to any facility. ?Lactic acidosis and AKI resolved. ?Urine culture are pending. ?Blood cultures are negative ? ?4/20: No acute concern.  Urine culture with more than 100,000 colonies of gram-negative rods, pending final results.  Wife just want PT at home which was ordered. ?Most likely will be discharged tomorrow once culture results are available ? ? ?Assessment and Plan: ?* Sepsis ([place]) ?Fever tachycardia and tachypnea with leukocytosis and elevated lactic acid ?Received IV fluid and antibiotics.  Most likely  secondary to UTI,blood cultures negative.  Lactic acidosis resolved.  Urine cultures with more than 100,000 colonies of gram-negative rods ?-Continue with Zosyn and Zithromax ?-Follow-up cultures ? ?Urinary tract infection without hematuria ?Suprapubic catheter exchange in the ED ?Continue Zosyn and follow cultures ? ?Pneumonia, possible ?Based on chest x-ray findings ?Possibility of aspiration, patient is also having some worsening cough. ?Continue Zosyn as well as azithromycin ?Aspiration precautions ? ?Suprapubic catheter ([place]) ?Exchange in the ED ? ?Acute metabolic encephalopathy ?Improved, appears to be at baseline now. ?Neurologic checks with fall and aspiration precautions ?Diet was started and he was able to tolerate well. ?PT is recommending home health which were ordered ? ?AKI (acute kidney injury) ([place]) ?Resolved. ?- monitor renal function ?-Avoid nephrotoxins ? ?Dementia ([place]) ?-Resume Namenda and mirtazapine and Depakote. ?-Delirium precautions ? ?Presence of permanent cardiac pacemaker ?No acute disease suspected ? ?Hypertension ?Blood pressure currently stable.  ?-Keep holding home antihypertensives ?-We will resume home medications once blood pressure started trending up ? ?Diabetes mellitus without complication ([place]) ?A1c of 7.9.  CBG remained mildly elevated ?-Continue with SSI ?-Increase Semglee to 8 units twice daily ? ?Subjective: Patient was resting comfortably when seen today.  No new complaints.  Wife at bedside. ? ?Physical Exam: ?Vitals:  ? 10/27/21 2058 10/28/21 0543 10/28/21 0900 10/28/21 1604  ?BP: 129/79 140/79 117/72 132/71  ?Pulse: 84 69 69 72  ?Resp: '18 18 17 19  '$ ?Temp: 98.9 ?F (37.2 ?C) 98.1 ?F (36.7 ?C) 97.9 ?F (36.6 ?C) 98.8 ?F (37.1 ?C)  ?TempSrc:  Oral Oral Oral  ?SpO2: 95% 98% 96% 100%  ?Weight:      ?  Height:      ? ?General.  Cognitively impaired elderly man, in no acute distress. ?Pulmonary.  Lungs clear bilaterally, normal respiratory effort. ?CV.  Regular rate and rhythm,  no JVD, rub or murmur. ?Abdomen.  Soft, nontender, nondistended, BS positive. ?CNS.  Alert and oriented to self.  No focal neurologic deficit. ?Extremities.  No edema, no cyanosis, pulses intact and symmetrical. ?Psychiatry.  Judgment and insight appears impaired. ? ?Data Reviewed: ?Prior notes and labs reviewed ? ?Family Communication: Discussed with wife at bedside ? ?Disposition: ?Status is: Inpatient ?Remains inpatient appropriate because: Severity of illness ? ? Planned Discharge Destination: Home with Home Health ? ?DVT prophylaxis.  Lovenox ? ?Time spent: 45 minutes ? ?This record has been created using Systems analyst. Errors have been sought and corrected,but may not always be located. Such creation errors do not reflect on the standard of care. ? ?Author: ?Lorella Nimrod, MD ?10/28/2021 4:36 PM ? ?For on call review [email or web address].  ?

## 2021-10-28 NOTE — Evaluation (Signed)
Occupational Therapy Evaluation ?Patient Details ?Name: Cole LIKES Sr. ?MRN: [id number] ?DOB: [birth year] ?Today's Date: 10/28/2021 ? ? 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  ? ?Clinical Impression ?  ?Cole Mccarty presents with generalized weakness, impaired balance, reduced endurance, and cognitive impairments. He lives at home with his wife, who serves as his 24-hour caregiver. Pt requires assistance with dressing, toileting, grooming, self-feeding, bathing, secondary to cognitive impairments. Per wife, pt can feed self but often tries to do things such as eat napkin. Pt can stand at sink and physically perform grooming tasks, but will, for example, pick up deodorant and rub it on toothbrush. He is able to manage physically without AD, with fair-good balance, while ambulating slowly. Discussed with wife DME that may be helpful (shower chair or 3-in-1), community supports for individuals with dementia and caregivers. Offered referral for HHOT to assist pt with bathing, falls prevention, evaluation for home modifications, etc., but wife declines, stating that pt's confusion increases with strangers coming into the home, so she felt this would be more trouble than helpful at this point, given that physically pt is managing fairly well at the moment. Pt appears to be at baseline level of functional mobility at present, no further OT services required. Will sign off.  ?   ? ?Recommendations for follow up therapy are one component of a multi-disciplinary discharge planning process, led by the attending physician.  Recommendations may be updated based on patient status, additional functional criteria and insurance authorization.  ? ?Follow Up  Recommendations ? No OT follow up  ?  ?Assistance Recommended at Discharge Frequent or constant Supervision/Assistance  ?Patient can return home with the following A little help with walking and/or transfers;A little help with bathing/dressing/bathroom;Assistance with feeding;Direct supervision/assist for financial management;Assist for transportation;Direct supervision/assist for medications management;Assistance with cooking/housework ? ?  ?Functional Status Assessment ? Patient has not had a recent decline in their functional status  ?Equipment Recommendations ? Tub/shower seat  ?  ?Recommendations for Other Services   ? ? ?  ?Precautions / Restrictions Precautions ?Precautions: Fall ?Restrictions ?Weight Bearing Restrictions: No  ? ?  ? ?Mobility Bed Mobility ?Overal bed mobility: Needs Assistance ?Bed Mobility: Supine to Sit, Sit to Supine ?  ?  ?Supine to sit: Supervision ?Sit to supine: Supervision ?  ?General bed mobility comments: Requires tactile cueing for task initiation and completion ?  ? ?Transfers ?  ?Equipment used: 1 person hand held assist ?  ?  ?  ?  ?  ?  ?  ?  ?  ? ?  ?Balance Overall balance assessment: Needs assistance ?Sitting-balance support: No upper extremity supported, Feet supported ?Sitting balance-Leahy Scale: Good ?Sitting balance - Comments: able to reach outside BOS ?  ?Standing balance support: Bilateral upper extremity supported ?Standing balance-Leahy Scale: Good ?  ?  ?  ?  ?  ?  ?  ?  ?  ?  ?  ?  ?   ? ?ADL either performed or assessed with clinical judgement  ? ?ADL Overall ADL's : At baseline ?  ?  ?  ?  ?  ?  ?  ?  ?  ?  ?  ?  ?  ?  ?  ?  ?  ?  ?  ?   ? ? ? ?  Vision Patient Visual Report: No change from baseline ?   ?   ?Perception   ?  ?Praxis   ?  ? ?Pertinent Vitals/Pain Pain Assessment ?Pain Assessment: PAINAD ?Breathing: normal ?Negative Vocalization: none ?Facial Expression: smiling or inexpressive ?Body Language: relaxed ?Consolability: no need to console ?PAINAD  Score: 0  ? ? ? ?Hand Dominance   ?  ?Extremity/Trunk Assessment Upper Extremity Assessment ?Upper Extremity Assessment: Generalized weakness ?  ?Lower Extremity Assessment ?Lower Extremity Assessment: Generalized weakness ?  ?  ?  ?Communication Communication ?Communication: HOH ?  ?Cognition Arousal/Alertness: Awake/alert, Lethargic ?Behavior During Therapy: [facility] for tasks assessed/performed ?Overall Cognitive Status: History of cognitive impairments - at baseline ?  ?  ?  ?  ?  ?  ?  ?  ?  ?  ?  ?  ?  ?  ?  ?  ?General Comments: oriented to self only ?  ?  ?General Comments    ? ?  ?Exercises Other Exercises ?Other Exercises: Educ with spouse re: DME, falls prevention, community supports, DC recs ?  ?Shoulder Instructions    ? ? ?Home Living Family/patient expects to be discharged to:: Private residence ?Living Arrangements: Spouse/significant other ?Available Help at Discharge: Family;Available 24 hours/day ?Type of Home: House ?Home Access: Stairs to enter ?Entrance Stairs-Number of Steps: 2 ?Entrance Stairs-Rails: Left ?Home Layout: One level ?  ?  ?Bathroom Shower/Tub: Walk-in shower ?  ?Bathroom Toilet: Standard ?  ?  ?Home Equipment: Wheelchair - manual ?  ?  ?  ? ?  ?Prior Functioning/Environment Prior Level of Function : Needs assist ?  ?  ?  ?  ?  ?  ?Mobility Comments: No AD ?ADLs Comments: Wife assists with all ADL; performs all IADL ?  ? ?  ?  ?OT Problem List: Decreased strength;Decreased activity tolerance;Impaired balance (sitting and/or standing) ?  ?   ?OT Treatment/Interventions:    ?  ?OT Goals(Current goals can be found in the care plan section) Acute Rehab OT Goals ?Patient Stated Goal: to be able to manage at home ?OT Goal Formulation: With family ?Time For Goal Achievement: 11/11/21 ?Potential to Achieve Goals: Good  ?OT Frequency:   ?  ? ?Co-evaluation   ?  ?  ?  ?  ? ?  ?AM-PAC OT "6 Clicks" Daily Activity     ?Outcome Measure Help from another person eating meals?: A Little ?Help from  another person taking care of personal grooming?: A Little ?Help from another person toileting, which includes using toliet, bedpan, or urinal?: A Little ?Help from another person bathing (including washing, rinsing, drying)?: A Lot ?Help from another person to put on and taking off regular upper body clothing?: A Little ?Help from another person to put on and taking off regular lower body clothing?: A Little ?6 Click Score: 17 ?  ?End of Session   ? ?Activity Tolerance: Patient tolerated treatment well ?Patient left: in bed;with family/visitor present;with call bell/phone within reach;with bed alarm set ? ?OT Visit Diagnosis: Muscle weakness (generalized) (M62.81)  ?              ?Time: 1012-1030 ?OT Time Calculation (min): 18 min ?Charges:  OT General Charges ?$OT Visit: 1 Visit ?OT Evaluation ?$OT Eval Low Complexity: 1 Low ?OT Treatments ?$Self Care/Home Management : 8-22 mins ?Josiah Lobo, PhD, MS, OTR/L ?10/28/21, 10:51 AM ? ?

## 2021-10-28 NOTE — Progress Notes (Signed)
Physical Therapy Treatment ?Patient Details ?Name: Cole Mccarty. ?MRN: [id number] ?DOB: Feb 11, 1941 ?Today's Date: 10/28/2021 ? ? 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 ? ?  ?PT Comments  ? ? Physical Therapy treatment completed this session. Patient tolerated session well and was agreeable to treatment. Upon entry into room patient was resting comfortable in bed with wife present. Patient did not verbalize or indicate any pain throughout session. Bed mobility and sit to stand transfers from EOB continue to be completed at supervision with no physical assistance or AD needed. Additional 10 sit to stands from EOB, LAQ, and seated marches were completed for BLE strengthening. Patient was able to ambulate ~80 feet with no AD at [facility]. Patient continues to ambulate with a shuffle like gait pattern, however no LOB was noted. Patient was left in room with all needs met and in reach. Patient would continue to benefit from skilled physical therapy in order to optimize patient's return to PLOF. Continue to recommend HHPT upon discharge from acute hospitalization.  ?   ?Recommendations for follow up therapy are one component of a multi-disciplinary discharge planning process, led by the attending physician.  Recommendations may be updated based on patient status, additional functional criteria and insurance authorization. ? ?Follow Up Recommendations ? Home health PT ?  ?  ?Assistance Recommended at Discharge Frequent or constant Supervision/Assistance  ?Patient can return home with the following A little help with walking and/or transfers;Assistance with cooking/housework;Assistance with feeding;A little help with bathing/dressing/bathroom;Direct  supervision/assist for medications management;Assist for transportation;Help with stairs or ramp for entrance;Direct supervision/assist for financial management ?  ?Equipment Recommendations ? Rolling walker (2 wheels)  ?  ?Recommendations for Other Services   ? ? ?  ?Precautions / Restrictions Precautions ?Precautions: Fall ?Restrictions ?Weight Bearing Restrictions: No  ?  ? ?Mobility ? Bed Mobility ?Overal bed mobility: Needs Assistance ?Bed Mobility: Supine to Sit, Sit to Supine ?  ?  ?Supine to sit: Supervision ?Sit to supine: Supervision ?  ?General bed mobility comments: Requires tactile cueing for task initiation and completion ?  ? ?Transfers ?Overall transfer level: Needs assistance ?Equipment used: None ?Transfers: Sit to/from Stand ?Sit to Stand: Supervision ?  ?  ?  ?  ?  ?General transfer comment: cueing for proper hand placement ?  ? ?Ambulation/Gait ?Ambulation/Gait assistance: Min guard ?Gait Distance (Feet): 80 Feet ?Assistive device: None ?Gait Pattern/deviations: Narrow base of support, Shuffle ?Gait velocity: decreased ?  ?  ?General Gait Details: mild unsteadiness, however no LOB noted ? ? ?Stairs ?  ?  ?  ?  ?  ? ? ?Wheelchair Mobility ?  ? ?Modified Rankin (Stroke Patients Only) ?  ? ? ?  ?Balance Overall balance assessment: Needs assistance ?Sitting-balance support: No upper extremity supported, Feet supported ?Sitting balance-Leahy Scale: Good ?Sitting balance - Comments: able to reach outside BOS ?  ?Standing balance support: Bilateral upper extremity supported ?Standing balance-Leahy Scale: Good ?Standing balance comment: U HHA or CGA to maintain balanced when ambulating ?  ?  ?  ?  ?  ?  ?  ?  ?  ?  ?  ?  ? ?  ?Cognition Arousal/Alertness: Awake/alert ?Behavior During Therapy: [facility] for tasks assessed/performed ?Overall Cognitive Status: History of cognitive impairments -  at baseline ?  ?  ?  ?  ?  ?  ?  ?  ?  ?  ?  ?  ?  ?  ?  ?  ?General Comments: sweet patient to work with ?  ?  ? ?   ?Exercises Other Exercises ?Other Exercises: x15 LAQ sitting EOB with visual target for increased range ?Other Exercises: x10 seated marches bilaterally with visual cueing ?Other Exercises: x10 sit to stands from EOB at SBA/SUP with no AD ? ?  ?General Comments General comments (skin integrity, edema, etc.): HR: 76bpm, SpO2 remained >90% on room air ?  ?  ? ?Pertinent Vitals/Pain    ? ? ?Home Living Family/patient expects to be discharged to:: Private residence ?Living Arrangements: Spouse/significant other ?Available Help at Discharge: Family;Available 24 hours/day ?Type of Home: House ?Home Access: Stairs to enter ?Entrance Stairs-Rails: Left ?Entrance Stairs-Number of Steps: 2 ?  ?Home Layout: One level ?Home Equipment: Wheelchair - manual ?   ?  ?Prior Function    ?  ?  ?   ? ?PT Goals (current goals can now be found in the care plan section) Acute Rehab PT Goals ?PT Goal Formulation: Patient unable to participate in goal setting ?Time For Goal Achievement: 11/10/21 ?Potential to Achieve Goals: Fair ?Progress towards PT goals: Progressing toward goals ? ?  ?Frequency ? ? ? Min 2X/week ? ? ? ?  ?PT Plan Current plan remains appropriate  ? ? ?Co-evaluation   ?  ?  ?  ?  ? ?  ?AM-PAC PT "6 Clicks" Mobility   ?Outcome Measure ? Help needed turning from your back to your side while in a flat bed without using bedrails?: A Little ?Help needed moving from lying on your back to sitting on the side of a flat bed without using bedrails?: A Little ?Help needed moving to and from a bed to a chair (including a wheelchair)?: A Little ?Help needed standing up from a chair using your arms (e.g., wheelchair or bedside chair)?: A Little ?Help needed to walk in hospital room?: A Little ?Help needed climbing 3-5 steps with a railing? : A Lot ?6 Click Score: 17 ? ?  ?End of Session Equipment Utilized During Treatment: Gait belt ?Activity Tolerance: Patient tolerated treatment well ?Patient left: in bed;with call bell/phone within  reach;with bed alarm set;with family/visitor present ?Nurse Communication: Mobility status ?PT Visit Diagnosis: Unsteadiness on feet (R26.81);Muscle weakness (generalized) (M62.81);Difficulty in walking, not elsewhere classified (R26.2) ?  ? ? ?Time: 1194-1740 ?PT Time Calculation (min) (ACUTE ONLY): 23 min ? ?Charges:  $Gait Training: 8-22 mins ?$Therapeutic Exercise: 8-22 mins          ?          ? ?Iva Boop, PT  ?10/28/21. 11:10 AM ? ? ?

## 2021-10-28 NOTE — Assessment & Plan Note (Signed)
A1c of 7.9.  CBG remained mildly elevated ?-Continue with SSI ?-Increase Semglee to 8 units twice daily ?

## 2021-10-28 NOTE — Assessment & Plan Note (Signed)
Exchange in the ED ?

## 2021-10-29 DIAGNOSIS — R6520 Severe sepsis without septic shock: Secondary | ICD-10-CM | POA: Diagnosis not present

## 2021-10-29 DIAGNOSIS — A419 Sepsis, unspecified organism: Secondary | ICD-10-CM | POA: Diagnosis not present

## 2021-10-29 DIAGNOSIS — G9340 Encephalopathy, unspecified: Secondary | ICD-10-CM | POA: Diagnosis not present

## 2021-10-29 LAB — URINE CULTURE: Culture: >=100000 — AB

## 2021-10-29 LAB — GLUCOSE, CAPILLARY
Glucose-Capillary: 117 mg/dL — ABNORMAL HIGH (ref 70–99)
Glucose-Capillary: 243 mg/dL — ABNORMAL HIGH (ref 70–99)

## 2021-10-29 MED ORDER — CEPHALEXIN 500 MG PO CAPS: 500.0000 mg | ORAL_CAPSULE | Freq: Two times a day (BID) | ORAL | 0 refills | Status: AC

## 2021-10-29 NOTE — Discharge Summary (Signed)
?Physician Discharge Summary ?  ?Patient: Cole Mccarty MRN: [id number] DOB: Aug 05, 1940  ?Admit date:     10/26/2021  ?Discharge date: 10/29/21  ?Discharge Physician: Lorella Nimrod  ? ?PCP: Derinda Late, MD  ? ?Recommendations at discharge:  ?Follow-up with primary care provider in 1 to 2 weeks. ? ?Discharge Diagnoses: ?Principal Problem: ?  Sepsis ([place]) ?Active Problems: ?  Urinary tract infection without hematuria ?  Suprapubic catheter ([place]) ?  Pneumonia, possible ?  Acute metabolic encephalopathy ?  Dementia ([place]) ?  AKI (acute kidney injury) ([place]) ?  Diabetes mellitus without complication ([place]) ?  Hypertension ?  Presence of permanent cardiac pacemaker ? ?Hospital Course: ?Taken from H&P. ? ?Cole MALAND Sr. is a 81 y.o. male with medical history significant for HTN, HLD, DM, complete heart block s/p pacemaker, liver cancer, dementia, hearing impairment, urinary retention with suprapubic catheter, who presents to the ED with fever, weakness and confusion increased from baseline.  Temperature at home was 103 and patient was unable to feed himself which is unusual for him.  Last suprapubic exchange was last week.  Wife also reports decreased oral intake but no vomiting. ?ED course and data review: Temperature 100.4 with pulse 101 and respirations 22.  WBC 13,000 with lactic acid 2.5.  Creatinine 1.32 above baseline of 0.9 to.  Urinalysis consistent with UTI.  COVID and flu negative.  Chest x-ray showing mild bibasilar opacities likely due to L atelectasis, infection or aspiration are additional considerations.  Patient started on Zosyn due to history of Enterococcus and Pseudomonas UTI.  Also given azithromycin for possible pneumonia.  Sepsis fluids started.  Hospitalist consulted for admission.  ? ?4/19: Patient was having some cough overnight.  Completely dependent for all ADLs per wife.  Outpatient palliative care follows him.  PT is recommending home health services as patient does not want to go to any  facility. ?Lactic acidosis and AKI resolved. ?Urine culture are pending. ?Blood cultures are negative ? ?4/20: No acute concern.  Urine culture with more than 100,000 colonies of gram-negative rods, pending final results.  Wife just want PT at home which was ordered. ? ?4/21: Urine culture with Klebsiella pneumonia and good sensitivity.  Patient received Zosyn while in the hospital and discharged on Keflex for 1 week for treatment of complicated UTI. ? ?Patient remained stable and will continue with current management and will follow-up with his providers.  Home PT was arranged.  Wife would like to take care of rest by herself and does not want any more services at this time and those can be added by PCP if needed. ? ?Assessment and Plan: ?* Sepsis (Cole Mccarty) ?Fever tachycardia and tachypnea with leukocytosis and elevated lactic acid ?Received IV fluid and antibiotics.  Most likely secondary to UTI,blood cultures negative.  Lactic acidosis resolved.  Urine cultures with more than 100,000 colonies of gram-negative rods ?-Continue with Zosyn and Zithromax ?-Follow-up cultures ? ?Urinary tract infection without hematuria ?Suprapubic catheter exchange in the ED ?Urine cultures grew Klebsiella pneumonia with good sensitivity. ?We will discontinue Zosyn and patient will be discharged on Keflex for 1 week based on sensitivity results. ? ?Pneumonia, possible ?Based on chest x-ray findings ?Possibility of aspiration, patient is also having some worsening cough. ?Continue Zosyn as well as azithromycin ?Aspiration precautions ? ?Suprapubic catheter ([place]) ?Exchange in the ED ? ?Acute metabolic encephalopathy ?Improved, appears to be at baseline now. ?Neurologic checks with fall and aspiration precautions ?Diet was started and he was able to tolerate  well. ?PT is recommending home health which were ordered ? ?AKI (acute kidney injury) (Cole Mccarty) ?Resolved. ?- monitor renal function ?-Avoid nephrotoxins ? ?Dementia (Cole Mccarty) ?-Resume Namenda  and mirtazapine and Depakote. ?-Delirium precautions ? ?Presence of permanent cardiac pacemaker ?No acute disease suspected ? ?Hypertension ?Blood pressure currently stable.  ?-Keep holding home antihypertensives ?-We will resume home medications once blood pressure started trending up ? ?Diabetes mellitus without complication (Cole Mccarty) ?A1c of 7.9.  CBG remained mildly elevated ?-Continue with SSI ?-Increase Semglee to 8 units twice daily ? ? ?Consultants: None  ?Procedures performed: Exchange of suprapubic catheter ?Disposition: Home health ?Diet recommendation:  ?Discharge Diet Orders (From admission, onward)  ? ?  Start     Ordered  ? 10/29/21 0000  Diet - low sodium heart healthy       ? 10/29/21 1109  ? ?  ?  ? ?  ? ?Cardiac and Carb modified diet ?DISCHARGE MEDICATION: ?Allergies as of 10/29/2021   ? ?   Reactions  ? Ezetimibe Other (See Comments)  ? unknown  ? Niacin Other (See Comments)  ? Unknown  ? Niacin-lovastatin Er   ? Unknown  ? Simvastatin   ? Unknown  ? ?  ? ?  ?Medication List  ?  ? ?STOP taking these medications   ? ?amLODipine 5 MG tablet ?Commonly known as: NORVASC ?  ? ?  ? ?TAKE these medications   ? ?aspirin EC 81 MG tablet ?Take 81 mg by mouth daily. ?  ?cephALEXin 500 MG capsule ?Commonly known as: KEFLEX ?Take 1 capsule (500 mg total) by mouth 2 (two) times daily for 7 days. ?  ?divalproex 250 MG 24 hr tablet ?Commonly known as: DEPAKOTE ER ?Take 250 mg by mouth daily. ?  ?glipiZIDE 5 MG tablet ?Commonly known as: GLUCOTROL ?Take 5 mg by mouth daily before breakfast. ?What changed: Another medication with the same name was removed. Continue taking this medication, and follow the directions you see here. ?  ?losartan-hydrochlorothiazide 100-25 MG tablet ?Commonly known as: HYZAAR ?Take 1 tablet by mouth daily. ?  ?memantine 10 MG tablet ?Commonly known as: NAMENDA ?Take 10 mg by mouth 2 (two) times daily. ?  ?metFORMIN 1000 MG tablet ?Commonly known as: GLUCOPHAGE ?Take 1,000 mg by mouth 2  (two) times daily. ?  ?mirtazapine 15 MG tablet ?Commonly known as: REMERON ?Take 15 mg by mouth at bedtime. ?  ?rosuvastatin 20 MG tablet ?Commonly known as: CRESTOR ?Take 20 mg by mouth daily. ?  ? ?  ? ? Follow-up Information   ? ? [facility] Follow up.   ?Specialty: Home Health Services ?Why: They will follow up with you for your home health needs: Physical therapy. ?Contact information: ?[address] ?[address] ?[place] [address] ?[phone] ? ? ?  ?  ? ? Derinda Late, MD. Schedule an appointment as soon as possible for a visit in 1 week(s).   ?Specialty: Family Medicine ?Contact information: ?[address] ?[facility] ?[place] [address] ?[phone] ? ? ?  ?  ? ?  ?  ? ?  ? ?Discharge Exam: ?Filed Weights  ? 10/26/21 1949 10/27/21 0059  ?Weight: 76.7 kg 81.5 kg  ? ?General.     In no acute distress. ?Pulmonary.  Lungs clear bilaterally, normal respiratory effort. ?CV.  Regular rate and rhythm, no JVD, rub or murmur. ?Abdomen.  Soft, nontender, nondistended, BS positive. ?CNS.  Alert and oriented to self, no  focal neurologic deficit. ?Extremities.  No edema, no cyanosis, pulses intact and symmetrical. ?Psychiatry.  Judgment and insight appears impaired.  ? ?Condition at discharge: stable ? ?The results of significant diagnostics from this hospitalization (including imaging, microbiology, ancillary and laboratory) are listed below for reference.  ? ?Imaging Studies: ?DG Chest 1 View ? ?Result Date: 10/26/2021 ?CLINICAL DATA:  Fever and weakness EXAM: CHEST  1 VIEW COMPARISON:  Chest x-ray dated October 28, 2020 FINDINGS: Left chest wall dual lead pacer with unchanged lead positioning. Mild basilar opacities. No large pleural effusion or pneumothorax. IMPRESSION: Mild bibasilar opacities, likely due to atelectasis, infection or aspiration are additional considerations. Electronically Signed   By: Yetta Glassman M.D.   On: 10/26/2021 20:21    ? ?Microbiology: ?Results for orders placed or performed during the hospital encounter of 10/26/21  ?Culture, blood (Routine x 2)     Status: None (Preliminary result)  ? Collection Time: 10/26/21  7:53 PM  ? Specimen:

## 2021-10-29 NOTE — Care Management Important Message (Signed)
Important Message ? ?Patient Details  ?Name: Cole WORLDS Sr. ?MRN: [id number] ?Date of Birth: 11/30/1940 ? ? ?Medicare Important Message Given:  Yes ? ? ? ? ?Dannette Barbara ?10/29/2021, 12:06 PM ?

## 2021-10-29 NOTE — Assessment & Plan Note (Signed)
Suprapubic catheter exchange in the ED ?Urine cultures grew Klebsiella pneumonia with good sensitivity. ?We will discontinue Zosyn and patient will be discharged on Keflex for 1 week based on sensitivity results. ?

## 2021-10-29 NOTE — Progress Notes (Signed)
Mobility Specialist - Progress Note ? ? 10/29/21 1100  ?Mobility  ?Activity Ambulated with assistance in hallway  ?Level of Assistance Standby assist, set-up cues, supervision of patient - no hands on  ?Assistive Device None  ?Distance Ambulated (ft) 320 ft  ?Activity Response Tolerated well  ?$Mobility charge 1 Mobility  ? ? ? ?Pt lying in bed upon arrival, utilizing RA. Pt confused; thinks the year is 1980 and is searching for [facility] unit piece---spouse confirms pt had an [employer]. Pt ambulated in hallway with supervision, no LOB. Short, shuffled steps but occasionally able to lengthen step-stride without cueing. Redirection needed throughout session. Pt left in bed with alarm set, needs in reach.  ? ? ?Kathee Delton ?Mobility Specialist ?10/29/21, 11:23 AM ? ? ? ?

## 2021-10-29 NOTE — Progress Notes (Signed)
Patient discharged to home with wife.  Discharge instructions reviewed and given to wife who verbalized understanding.   ?

## 2021-10-29 NOTE — TOC Transition Note (Signed)
Transition of Care (TOC) - CM/SW Discharge Note ? ? ?Patient Details  ?Name: Cole TEEPLE Sr. ?MRN: [id number] ?Date of Birth: 1940-09-26 ? ?Transition of Care (TOC) CM/SW Contact:  ?Candie Chroman, LCSW ?Phone Number: ?10/29/2021, 11:28 AM ? ? ?Clinical Narrative:   Patient has orders to discharge home today. [employer] representative is aware. No further concerns. CSW signing off. ? ?Final next level of care: [place] ?Barriers to Discharge: Barriers Resolved ? ? ?Patient Goals and CMS Choice ?Patient states their goals for this hospitalization and ongoing recovery are:: Patient not fully oriented. ?CMS Medicare.gov Compare Post Acute Care list provided to:: Patient Represenative (must comment) (Reviewed with wife over the phone.) ?Choice offered to / list presented to : Spouse ? ?Discharge Placement ?  ?           ?  ?Patient to be transferred to facility by: Wife ?Name of family member notified: Cole Mccarty ?Patient and family notified of of transfer: 10/29/21 ? ?Discharge Plan and Services ?  ?  ?Post Acute Care Choice: Home Health          ?  ?  ?  ?  ?  ?HH Arranged: PT ?[place] Agency: [facility] ?Date HH Agency Contacted: 10/29/21 ?  ?Representative spoke with at [facility]: Juanda Crumble ? ?Social Determinants of Health (SDOH) Interventions ?  ? ? ?Readmission Risk Interventions ?   ? View : No data to display.  ?  ?  ?  ? ? ? ? ? ?

## 2021-10-31 LAB — CULTURE, BLOOD (ROUTINE X 2)
Culture: NO GROWTH
Culture: NO GROWTH

## 2021-11-18 ENCOUNTER — Telehealth: Payer: Self-pay | Admitting: Student

## 2021-11-18 ENCOUNTER — Ambulatory Visit: Payer: Medicare PPO | Admitting: Physician Assistant

## 2021-11-18 DIAGNOSIS — R339 Retention of urine, unspecified: Secondary | ICD-10-CM | POA: Diagnosis not present

## 2021-11-18 DIAGNOSIS — Z435 Encounter for attention to cystostomy: Secondary | ICD-10-CM

## 2021-11-18 NOTE — Telephone Encounter (Signed)
Spoke with patient's wife Meridee Score and have rescheduled the 11/19/21 Palliative f/u visit to 12/07/21 @ 2:15 PM ?

## 2021-11-18 NOTE — Progress Notes (Signed)
Suprapubic Cath Change ? ?Patient is present today for a suprapubic catheter change due to urinary retention.  31m of water was drained from the balloon, a 16FR foley cath was removed from the tract without difficulty.  Site was cleaned and prepped in a sterile fashion with betadine.  A 16FR foley cath was replaced into the tract no complications were noted. Urine return was noted, 10 ml of sterile water was inflated into the balloon and a leg bag was attached for drainage.  Patient tolerated well.  ? ?Performed by: SDebroah Loop PA-C  ? ?Additional notes: Patient admitted since last SPT change with urosepsis. SPT exchanged in the ED on 4/18. Not clinically infected today. ? ?Follow up: No follow-ups on file.   ?

## 2021-11-19 ENCOUNTER — Other Ambulatory Visit: Payer: Medicare PPO | Admitting: Student

## 2021-12-07 ENCOUNTER — Other Ambulatory Visit: Payer: Medicare PPO | Admitting: Student

## 2021-12-07 DIAGNOSIS — I10 Essential (primary) hypertension: Secondary | ICD-10-CM

## 2021-12-07 DIAGNOSIS — Z515 Encounter for palliative care: Secondary | ICD-10-CM

## 2021-12-07 DIAGNOSIS — R339 Retention of urine, unspecified: Secondary | ICD-10-CM

## 2021-12-07 DIAGNOSIS — F0283 Dementia in other diseases classified elsewhere, unspecified severity, with mood disturbance: Secondary | ICD-10-CM

## 2021-12-07 DIAGNOSIS — G4700 Insomnia, unspecified: Secondary | ICD-10-CM

## 2021-12-07 NOTE — Progress Notes (Signed)
[facility] Consult Note Telephone: [phone]  Fax: [phone]    Date of encounter: 12/07/21 2:37 PM PATIENT NAME: Cole Mccarty [id number]   [phone] (home)  DOB: [birth year] MRN: [id number] PRIMARY CARE PROVIDER:    Derinda Late, MD,  [facility] [facility] [place] [address] [phone]  REFERRING PROVIDER:   Derinda Late, MD [phone] [address] [place] [facility] [place],   [address] [phone]  RESPONSIBLE PARTY:    Contact Information     Name Relation Home Work Mobile   Fields, Oros Spouse [phone]     Dontarius, Sheley [phone]   Mukesh, Kornegay [phone]  [phone]   [name] Daughter [phone]  [phone]   Hines, Kloss [phone]  [phone]        I met face to face with patient and family in the home. Palliative Care was asked to follow this patient by consultation request of  Derinda Late, MD to address advance care planning and complex medical decision making. This is a follow up visit.                                   ASSESSMENT AND PLAN / RECOMMENDATIONS:   Advance Care Planning/Goals of Care: Goals include to maximize quality of life and symptom management. Patient/health care surrogate gave his/her permission to discuss. Our advance care planning conversation included a discussion about:    The value and importance of advance care planning  Experiences with loved ones who have been seriously ill or have died  Exploration of personal, cultural or spiritual beliefs that might influence medical decisions  Exploration of goals of care in the event of a sudden injury or illness  Identification of a healthcare agent  Review and updating or creation of an advance directive document Reviewed code status; has discussed  with family and wants to elect DNR. DNR completed; uploaded to [facility]. CODE STATUS: DNR  Reviewed goals of care; wife would like for patient to remain in the home. We discussed palliative medicine vs. Hospice; discussed criteria for hospice. Wife would like to management patient in the home if possible, try to prevent hospitalizations if possible. Palliative Medicine will continue to provide ongoing support, symptom management, monitor for changes and declines.   Symptom Management/Plan:  Late onset Alzheimer's Dementia with agitation-patient with increased difficulty ambulating, gait is more shuffled. He has worsening confusion/forgetfulness, requiring more assistance with adl's; hearing is also worse. Behaviors have been managed with current regimen; continue namenda, Depakote and mirtazapine, which had been increased to 15 mg QHS. Reorient and redirect as needed.   Insomnia-continue mirtazapine 15 mg QHS.  Hypertension- blood pressure elevated during visit today. Patient did c/o headache last night. Wife to monitor blood pressure and notify PCP with readings if remains elevated. Continue amlodipine and losartan-hctz as directed.   Urinary retention-patient with suprapubic catheter; changed monthly with Urology. Monitor for s/s of UTI.   Follow up Palliative Care Visit: Palliative care will continue to follow for complex medical decision making, advance care planning, and clarification of goals. Return in 6 weeks or prn.   This visit was coded based on medical decision making (MDM).  PPS: 50%  HOSPICE ELIGIBILITY/DIAGNOSIS: TBD  Chief Complaint: Palliative Medicine follow up visit.   HISTORY OF PRESENT ILLNESS:  Cole  W Nile Sr. is a 80 y.o. year old male  with Late onset Alzheimer's dementia, ASCVD, moderate mitral insufficiency, complete heart block status post pacemaker placement, type 2 diabetes, hypertension, hyperlipidemia, PVD, GERD, history of lip cancer. Patient hospitalized  4/18-4/21/23 due to sepsis, UTI, possible pneumonia.   Patient with increased confusion and forgetfulness. He is requiring more frequent redirection and cueing. Wife is assisting with bathing; he now has a shower chair. His gait is more shuffled; he is not ambulating long distances outside of the house any longer. He denies pain except wife report patient stating he had a headache last night. His appetite is good, but this is in part to his wife making sure and encouraging him to eat. Has crying spells, asking why he is here. He is sleeping well at night. Wife contributes to HPI and ROS due to patient's dementia and his impaired hearing. He is able to answer questions, although he does not always hear accurately.   History obtained from review of EMR, discussion with primary team, and interview with family, facility staff/caregiver and/or Cole Mccarty.  I reviewed available labs, medications, imaging, studies and related documents from the EMR.  Records reviewed and summarized above.   ROS  General: NAD EYES: denies vision changes ENMT: denies dysphagia Cardiovascular: denies chest pain, denies DOE Pulmonary: denies cough, denies increased SOB Abdomen: endorses good appetite, denies constipation, endorses continence of bowel GU: denies dysuria; suprapubic catheter MSK:  denies increased weakness,  no falls reported Skin: denies rashes or wounds Neurological: denies pain, denies insomnia Psych: Endorses stable mood Heme/lymph/immuno: denies bruises, abnormal bleeding  Physical Exam:  Pulse 82, resp 16, bp 162/80, sats 95% on room air Constitutional: NAD General: frail appearing  EYES: anicteric sclera, lids intact, no discharge  ENMT: hard of  hearing, oral mucous membranes moist CV: S1S2, RRR, no LE edema Pulmonary: LCTA, no increased work of breathing, no cough, room air Abdomen: normo-active BS + 4 quadrants, soft and non tender, no ascites GU: deferred MSK: moves all extremities,  ambulatory Skin: warm and dry, no rashes or wounds on visible skin Neuro:  generalized weakness, A & O x 2, increased forgetfulness, repeats self Psych: non-anxious affect, pleasant and cooperative Hem/lymph/immuno: no widespread bruising   Thank you for the opportunity to participate in the care of Cole Mccarty.  The palliative care team will continue to follow. Please call our office at [phone] if we can be of additional assistance.   Ezekiel Slocumb, NP   COVID-19 PATIENT SCREENING TOOL Asked and negative response unless otherwise noted:   Have you had symptoms of covid, tested positive or been in contact with someone with symptoms/positive test in the past 5-10 days?

## 2021-12-16 ENCOUNTER — Ambulatory Visit: Payer: Medicare PPO | Admitting: Physician Assistant

## 2021-12-16 DIAGNOSIS — Z435 Encounter for attention to cystostomy: Secondary | ICD-10-CM

## 2021-12-16 DIAGNOSIS — R339 Retention of urine, unspecified: Secondary | ICD-10-CM | POA: Diagnosis not present

## 2021-12-16 NOTE — Progress Notes (Signed)
Suprapubic Cath Change  Patient is present today for a suprapubic catheter change due to urinary retention.  67m of water was drained from the balloon, a 16FR foley cath was removed from the tract without difficulty.  Site was cleaned and prepped in a sterile fashion with betadine.  A 16FR foley cath was replaced into the tract no complications were noted. Urine return was noted, 10 ml of sterile water was inflated into the balloon and a leg bag was attached for drainage.  Patient tolerated well.     Performed by: SDebroah Loop PA-C   Follow up: Return in about 4 weeks (around 01/13/2022) for SPT exchange.

## 2022-01-14 ENCOUNTER — Ambulatory Visit: Payer: Medicare PPO | Admitting: Physician Assistant

## 2022-01-14 ENCOUNTER — Encounter: Payer: Self-pay | Admitting: Physician Assistant

## 2022-01-14 VITALS — Ht 67.0 in | Wt 179.0 lb

## 2022-01-14 DIAGNOSIS — R339 Retention of urine, unspecified: Secondary | ICD-10-CM

## 2022-01-14 DIAGNOSIS — Z435 Encounter for attention to cystostomy: Secondary | ICD-10-CM

## 2022-01-14 NOTE — Progress Notes (Signed)
Suprapubic Cath Change  Patient is present today for a suprapubic catheter change due to urinary retention.  36m of water was drained from the balloon, a 16FR foley cath was removed from the tract without difficulty.  Site was cleaned and prepped in a sterile fashion with betadine.  A 16FR foley cath was replaced into the tract no complications were noted. Urine return was not noted, 10 ml of sterile water was inflated into the balloon and a leg bag was attached for drainage.  Patient tolerated well.   Performed by: SDebroah Loop PA-C   Follow up: Return in about 4 weeks (around 02/11/2022) for SPT exchange.

## 2022-01-20 ENCOUNTER — Other Ambulatory Visit: Payer: Medicare PPO | Admitting: Student

## 2022-01-20 DIAGNOSIS — R339 Retention of urine, unspecified: Secondary | ICD-10-CM

## 2022-01-20 DIAGNOSIS — Z515 Encounter for palliative care: Secondary | ICD-10-CM

## 2022-01-20 DIAGNOSIS — F0283 Dementia in other diseases classified elsewhere, unspecified severity, with mood disturbance: Secondary | ICD-10-CM

## 2022-01-20 NOTE — Progress Notes (Signed)
[facility] Consult Note Telephone: [phone]  Fax: [phone]    Date of encounter: 01/20/22 2:14 PM PATIENT NAME: Cole Mccarty [id number]   [phone] (home)  DOB: 1940/11/04 MRN: [id number] PRIMARY CARE PROVIDER:    Derinda Late, MD,  [place] [facility] [place] [address] [phone]  REFERRING PROVIDER:   Derinda Late, MD [phone] [address] [place] [facility] [place],  [place] [address] [phone]  RESPONSIBLE PARTY:    Contact Information     Name Relation Home Work Mobile   [name], [name] Spouse [phone]     [name], [name] [phone]   [name], [name] [phone]  [phone]   [name] Daughter [phone]  [phone]   [name], [name] [phone]  [phone]        I met face to face with patient and family in the home. Palliative Care was asked to follow this patient by consultation request of  Derinda Late, MD to address advance care planning and complex medical decision making. This is a follow up visit.                                   ASSESSMENT AND PLAN / RECOMMENDATIONS:   Advance Care Planning/Goals of Care: Goals include to maximize quality of life and symptom management. Patient/health care surrogate gave his/her permission to discuss. Our advance care planning conversation included a discussion about:    The value and importance of advance care planning  Experiences with loved ones who have been seriously ill or have died  Exploration of personal, cultural or spiritual beliefs that might influence medical decisions  Exploration of goals of care in the event of a sudden injury or illness  CODE STATUS: DNR  Education provided on Palliative Medicine vs. Hospice. Patient with cognitive and functional declines. Will reach out to PCP  to request order for hospice evaluation. We discussed long range planning as patient is requiring much more assistance in the home; staying in home vs. Placement. Discussed with patient's wife and DIL Maranda.  Symptom Management/Plan:  Dementia-patient with increased confusion, difficulty with word finding. He is requiring more assistance with all adl's. His appetite has declined. Patient with multiple falls in past 2 months. He has worsening behaviors, was unable to tolerate Seroquel. Wife has restarted his namenda; continue Depakote, mirtazapine as directed.   Urinary retention-patient with suprapubic catheter; changed monthly with Urology. Monitor for s/s of UTI.   Follow up Palliative Care Visit: Palliative care will continue to follow for complex medical decision making, advance care planning, and clarification of goals. Return in 4 weeks or prn.  I spent 65 minutes providing this consultation. More than 50% of the time in this consultation was spent in counseling and care coordination.  PPS: 40%  HOSPICE ELIGIBILITY/DIAGNOSIS: TBD  Chief Complaint: Palliative Medicine follow up visit.  HISTORY OF PRESENT ILLNESS:  Cole Mccarty. is a 81 y.o. year old male  with  Late onset Alzheimer's dementia, ASCVD, moderate mitral insufficiency, complete heart block status post pacemaker placement, type 2 diabetes, hypertension, hyperlipidemia, PVD, GERD, hx of TIA's, history of lip cancer. Patient hospitalized 4/18-4/21/23 due to sepsis, UTI, possible pneumonia.    Patient requiring more assistance with adl's. Much cueing/redirection; increased confusion, difficulty with word finding. Wife has to now dress  patient, he is no longer able to manage drainage bag. Suprapubic catheter changed out monthly by urology.Tremor is worse. 4-5 falls in past 2 months; last fall 2 days ago. Very poor safety awareness. No edema. Gait is more shuffled. Weight today 168; down 10 pounds in past 3 weeks; eating 2-3  meals, down to eating 50% meals now. Increased coughing with solids. Sleeping more during the day, sometimes up every hour during the night. Patient had been started on Seroquel; wife stopped due to worsening agitation. Wife expresses caregiver fatigue.   Patient received sitting in recliner. He has pleasant, non-anxious affect. He does have more difficulty with word finding. Poor safety awareness; he required assistance coming to standing position and he was going to sit on foot on recliner when he went to sit back down. Patient able to answer direct questions; he is very hard of hearing. HPI and ROS primarily obtained from family due to his dementia.   History obtained from review of EMR, discussion with primary team, and interview with family, facility staff/caregiver and/or [name].  I reviewed available labs, medications, imaging, studies and related documents from the EMR.  Records reviewed and summarized above.     Physical Exam: Current and past weights: Pulse 68, resp 16, b/p 150/80, sats 95% on room air.  Constitutional: NAD General: frail appearing EYES: anicteric sclera, lids intact, no discharge  ENMT: oral mucous membranes moist CV: S1S2, RRR, no LE edema Pulmonary: LCTA, no increased work of breathing, no cough, room air Abdomen:  normo-active BS + 4 quadrants, soft and non tender, no ascites GU: deferred MSK: moves all extremities, ambulatory; gait shuffled Skin: warm and dry, no rashes or wounds on visible skin Neuro: +generalized weakness, A & O to person, familiars Psych: non-anxious affect, cooperative Hem/lymph/immuno: no widespread bruising   Thank you for the opportunity to participate in the care of Cole Mccarty.  The palliative care team will continue to follow. Please call our office at [phone] if we can be of additional assistance.   Ezekiel Slocumb, NP   COVID-19 PATIENT SCREENING TOOL Asked and negative response unless otherwise noted:   Have you had  symptoms of covid, tested positive or been in contact with someone with symptoms/positive test in the past 5-10 days? No

## 2022-02-09 ENCOUNTER — Telehealth: Payer: Self-pay | Admitting: Physician Assistant

## 2022-02-09 NOTE — Telephone Encounter (Signed)
Pt has appt this Friday at 1:30 w/Sam and hospice has taken over his medical care.  Pt would like a call back.  She thinks they will take over his tube change.

## 2022-02-09 NOTE — Telephone Encounter (Signed)
Pt is now in the care of hospice and they will take over his cath changes.

## 2022-02-10 NOTE — Telephone Encounter (Signed)
Spoke with patient's wife and advised results  

## 2022-02-10 NOTE — Telephone Encounter (Signed)
Please let them know we wish them well and are available to provide verbal or written orders as needed.

## 2022-02-11 ENCOUNTER — Encounter: Payer: Medicare PPO | Admitting: Physician Assistant

## 2022-04-10 DEATH — deceased

## 2023-01-30 IMAGING — DX DG CHEST 1V PORT
1 series · 1 of 1 positions shown · non-contrast
Comparison: Chest x-ray 05/12/2019.
COMPARISON: Chest x-ray 05/12/2019.

Addendum:
CLINICAL DATA: Questionable sepsis.

EXAM:
PORTABLE CHEST 1 VIEW

[chest ap]
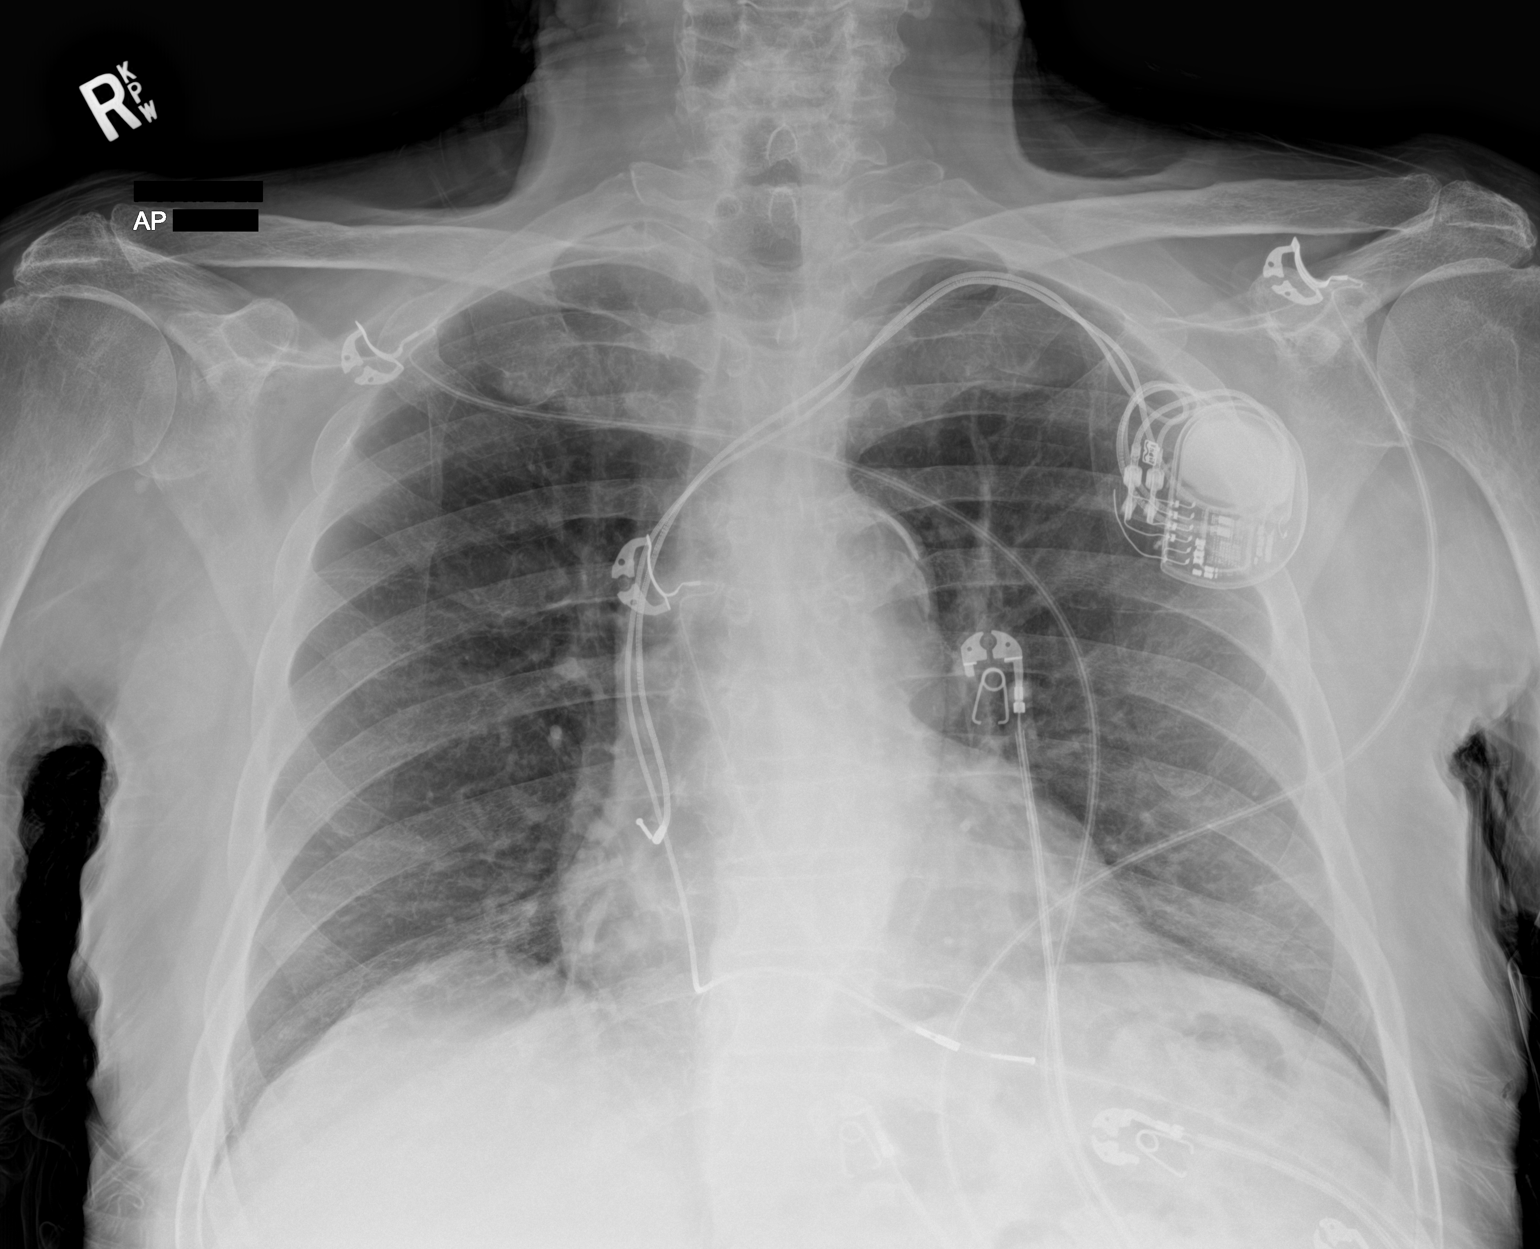

[1 of 1 positions shown; findings below may reference images not displayed]

FINDINGS: Cardiac pacer stable position. Heart size upper limits normal. No
pulmonary venous congestion. Low lung volumes with mild bibasilar
atelectasis. No pleural effusion or pneumothorax.
IMPRESSION: 1. Cardiac pacer stable position. Heart size upper limits normal. No
pulmonary venous congestion.

2.  Low lung volumes with bibasilar atelectasis.

ADDENDUM:
Carotid vascular calcification consistent with carotid
atherosclerotic vascular disease incidentally noted.

*** End of Addendum ***
FINDINGS: Cardiac pacer stable position. Heart size upper limits normal. No
pulmonary venous congestion. Low lung volumes with mild bibasilar
atelectasis. No pleural effusion or pneumothorax.
IMPRESSION: 1. Cardiac pacer stable position. Heart size upper limits normal. No
pulmonary venous congestion.

2.  Low lung volumes with bibasilar atelectasis.

## 2023-02-13 IMAGING — US US EXTREM LOW VENOUS*L*
1 series · 14 of 24 positions shown · non-contrast
Comparison: None.

CLINICAL DATA: Left leg and foot pain.  Swelling.

EXAM:
LEFT LOWER EXTREMITY VENOUS DOPPLER ULTRASOUND
TECHNIQUE: Gray-scale sonography with compression, as well as color and duplex
ultrasound, were performed to evaluate the deep venous system(s)
from the level of the common femoral vein through the popliteal and
proximal calf veins.

[Series 1: us extrem low venous*left* · 0.08mm/px · 14 of 37 slices shown]
[im 1/37]
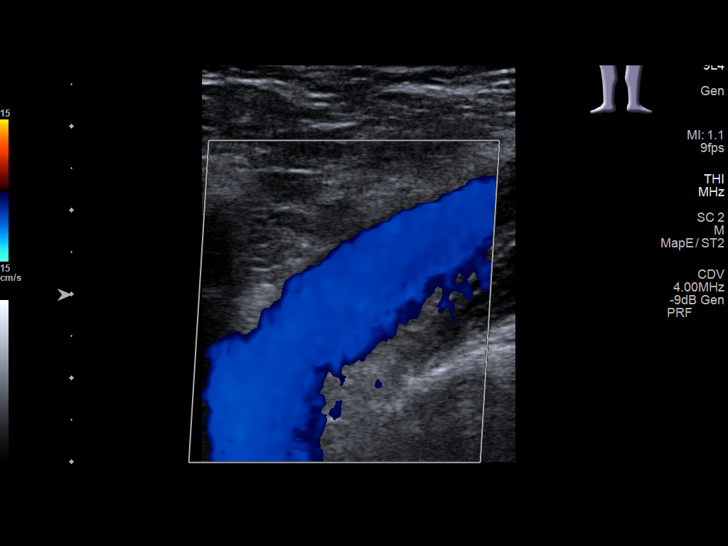
[im 4/37]
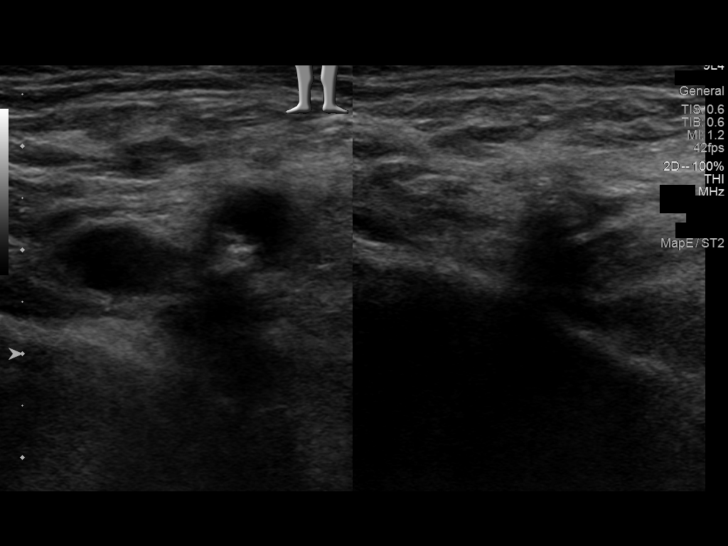
[im 7/37]
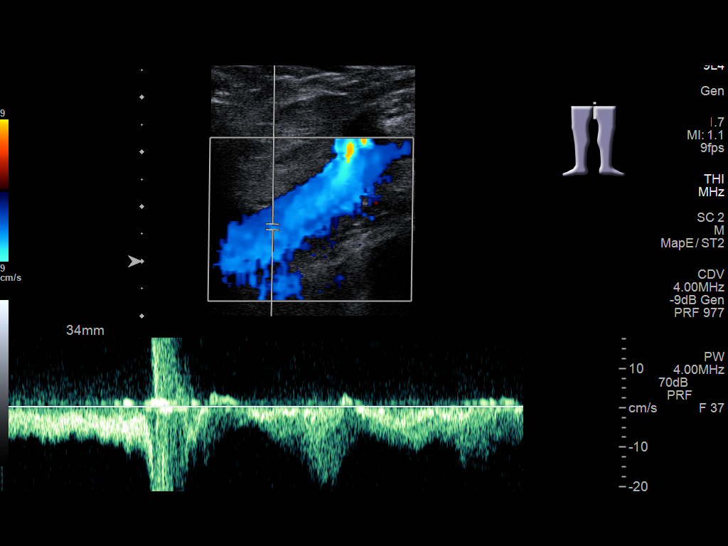
[im 10/37]
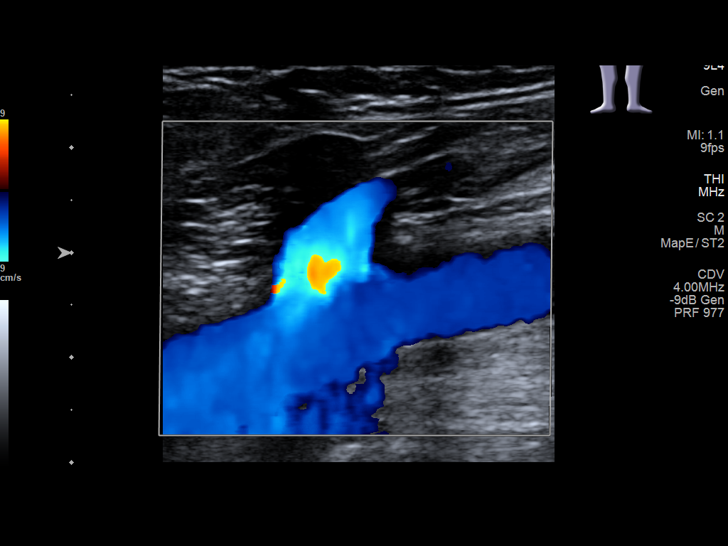
[im 11/37]
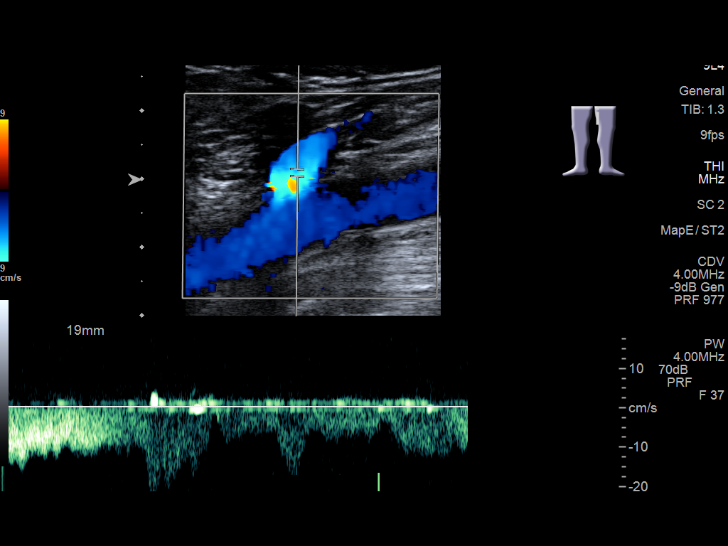
[im 15/37]
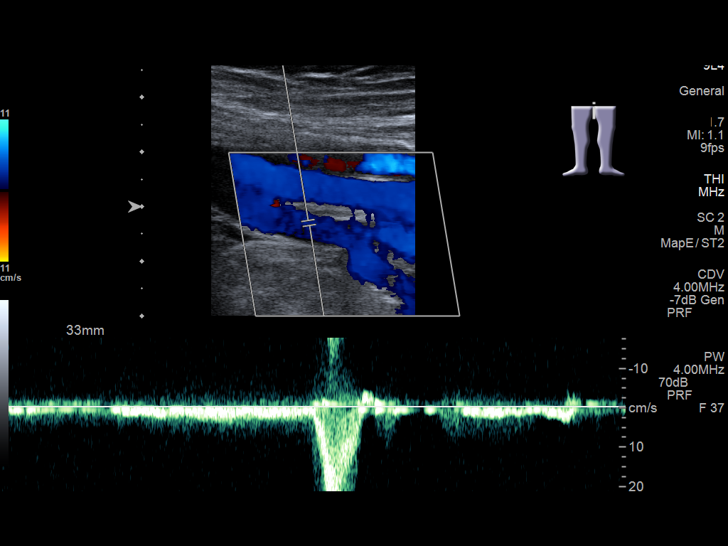
[im 18/37]
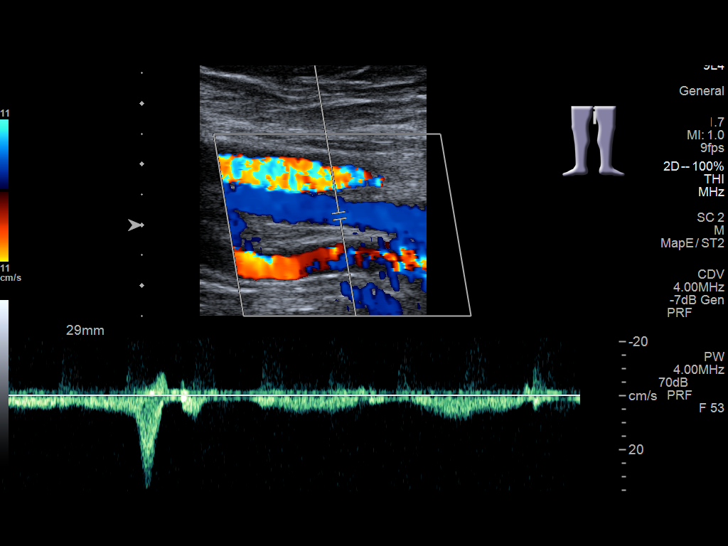
[im 19/37]
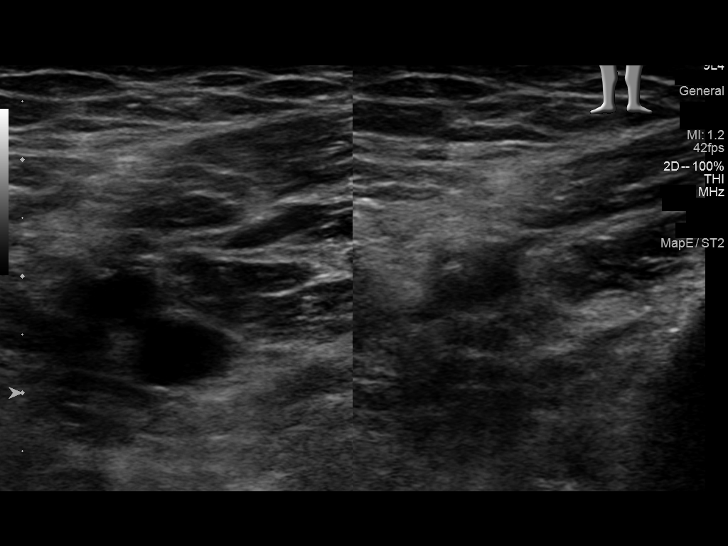
[im 22/37]
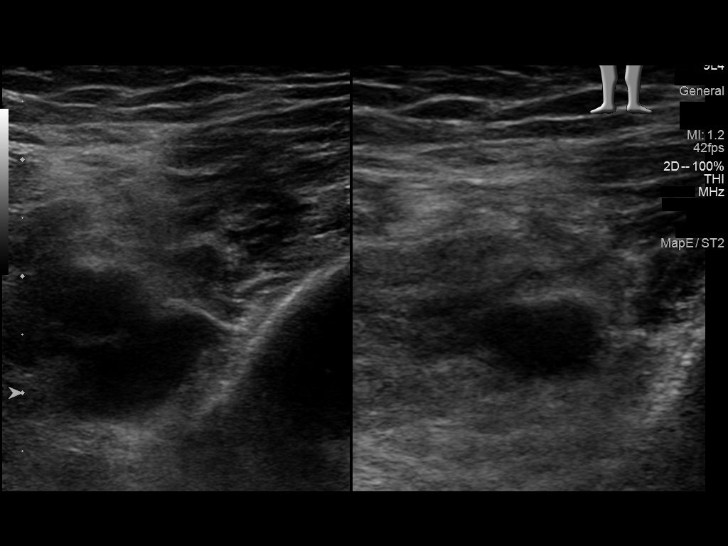
[im 26/37]
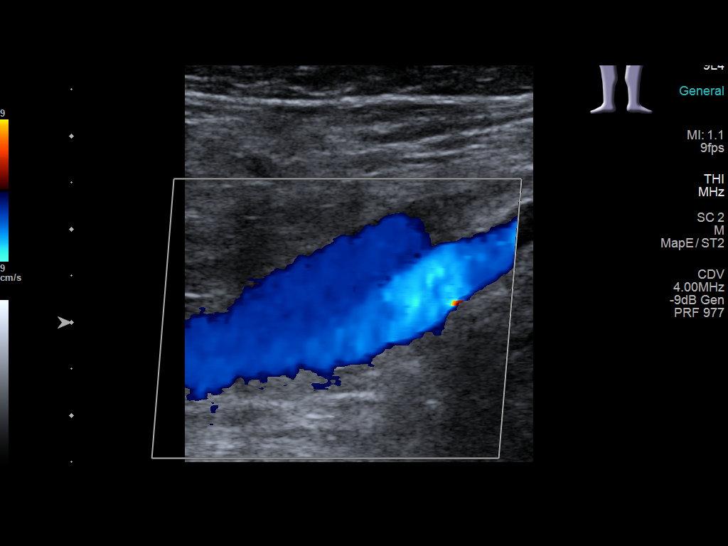
[im 29/37]
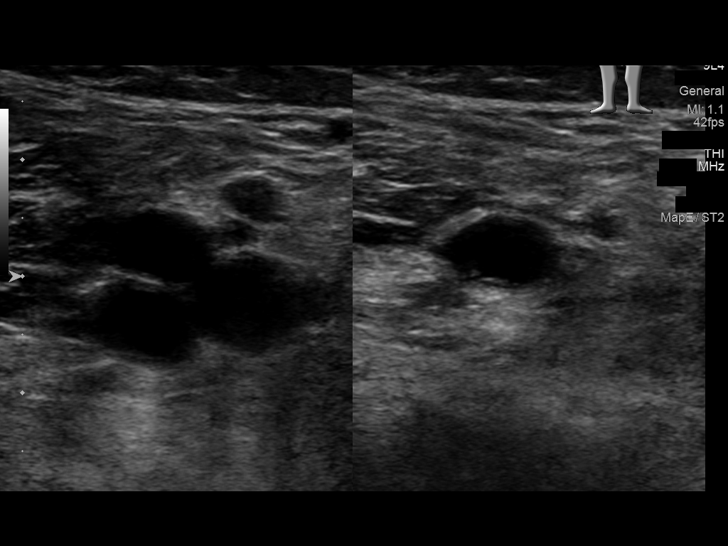
[im 30/37]
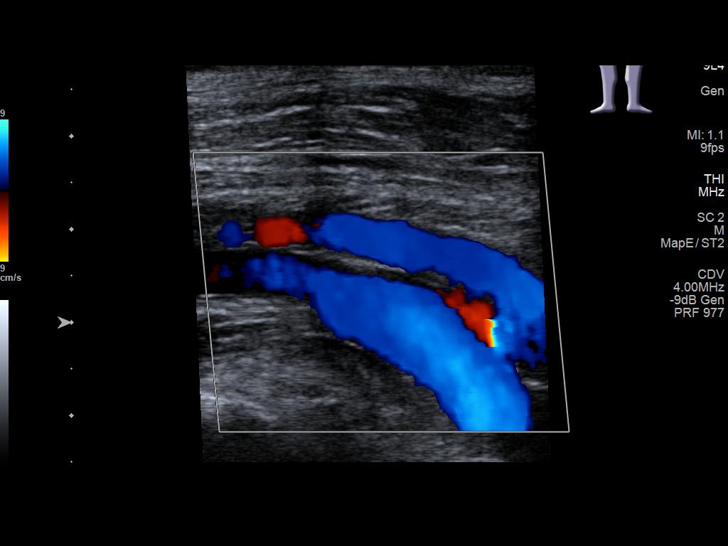
[im 33/37]
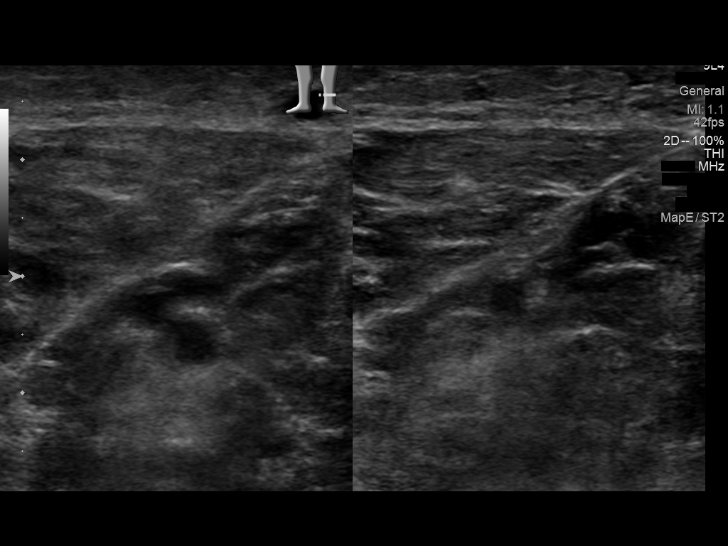
[im 37/37]
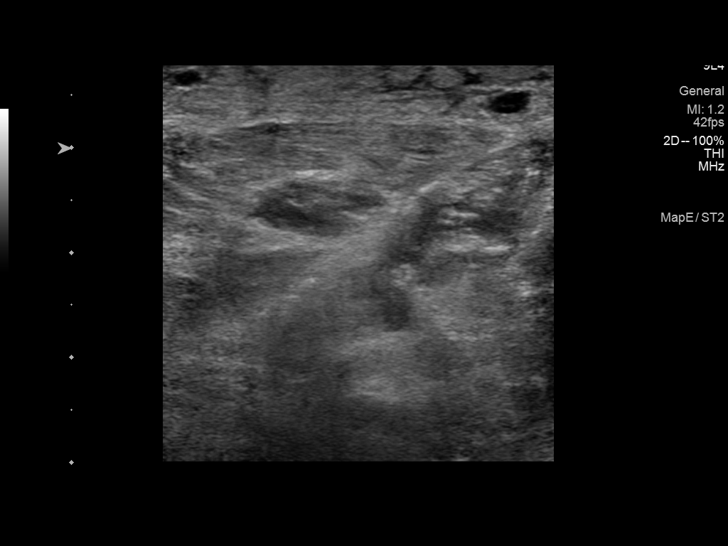

[14 of 24 positions shown; findings below may reference images not displayed]

FINDINGS: VENOUS

Normal compressibility of the common femoral, superficial femoral,
and popliteal veins, as well as the visualized calf veins.
Visualized portions of profunda femoral vein and great saphenous
vein unremarkable. No filling defects to suggest DVT on grayscale or
color Doppler imaging. Doppler waveforms show normal direction of
venous flow, normal respiratory plasticity and response to
augmentation.

Limited views of the contralateral common femoral vein are
unremarkable.

OTHER

None.
IMPRESSION: Negative.
